# Patient Record
Sex: Female | Born: 1975 | Race: White | Hispanic: No | Smoking: Current every day smoker
Health system: Southern US, Community
[De-identification: ages and names within clinical notes are randomized; demographics above are authoritative.]

## PROBLEM LIST (undated history)

## (undated) DIAGNOSIS — F32A Depression, unspecified: Secondary | ICD-10-CM

## (undated) DIAGNOSIS — B019 Varicella without complication: Secondary | ICD-10-CM

## (undated) DIAGNOSIS — F329 Major depressive disorder, single episode, unspecified: Secondary | ICD-10-CM

## (undated) DIAGNOSIS — J439 Emphysema, unspecified: Secondary | ICD-10-CM

## (undated) DIAGNOSIS — R519 Headache, unspecified: Secondary | ICD-10-CM

## (undated) DIAGNOSIS — R51 Headache: Secondary | ICD-10-CM

## (undated) DIAGNOSIS — F419 Anxiety disorder, unspecified: Secondary | ICD-10-CM

## (undated) DIAGNOSIS — E039 Hypothyroidism, unspecified: Secondary | ICD-10-CM

## (undated) DIAGNOSIS — G43909 Migraine, unspecified, not intractable, without status migrainosus: Secondary | ICD-10-CM

## (undated) DIAGNOSIS — T7840XA Allergy, unspecified, initial encounter: Secondary | ICD-10-CM

## (undated) HISTORY — DX: Headache, unspecified: R51.9

## (undated) HISTORY — DX: Migraine, unspecified, not intractable, without status migrainosus: G43.909

## (undated) HISTORY — DX: Varicella without complication: B01.9

## (undated) HISTORY — DX: Headache: R51

## (undated) HISTORY — DX: Anxiety disorder, unspecified: F41.9

## (undated) HISTORY — DX: Depression, unspecified: F32.A

## (undated) HISTORY — DX: Allergy, unspecified, initial encounter: T78.40XA

## (undated) HISTORY — DX: Hypothyroidism, unspecified: E03.9

## (undated) HISTORY — DX: Major depressive disorder, single episode, unspecified: F32.9

## (undated) HISTORY — DX: Emphysema, unspecified: J43.9

## (undated) HISTORY — PX: NO PAST SURGERIES: SHX2092

---

## 2004-06-10 ENCOUNTER — Ambulatory Visit: Payer: Self-pay | Admitting: Family Medicine

## 2007-12-02 ENCOUNTER — Ambulatory Visit: Payer: Self-pay | Admitting: Unknown Physician Specialty

## 2008-08-09 ENCOUNTER — Ambulatory Visit: Payer: Self-pay | Admitting: Nurse Practitioner

## 2009-10-01 ENCOUNTER — Ambulatory Visit: Payer: Self-pay | Admitting: Obstetrics and Gynecology

## 2010-05-14 ENCOUNTER — Ambulatory Visit: Payer: Self-pay | Admitting: Internal Medicine

## 2011-01-17 ENCOUNTER — Other Ambulatory Visit (HOSPITAL_COMMUNITY): Payer: Self-pay | Admitting: Family Medicine

## 2011-01-17 DIAGNOSIS — R599 Enlarged lymph nodes, unspecified: Secondary | ICD-10-CM

## 2011-01-20 ENCOUNTER — Ambulatory Visit (HOSPITAL_COMMUNITY)
Admission: RE | Admit: 2011-01-20 | Discharge: 2011-01-20 | Disposition: A | Discharge status: Home / Self Care | Payer: 59 | Source: Ambulatory Visit | Attending: Family Medicine | Admitting: Family Medicine

## 2011-01-20 DIAGNOSIS — R599 Enlarged lymph nodes, unspecified: Secondary | ICD-10-CM

## 2011-05-05 ENCOUNTER — Ambulatory Visit: Payer: Self-pay

## 2011-12-29 ENCOUNTER — Other Ambulatory Visit (INDEPENDENT_AMBULATORY_CARE_PROVIDER_SITE_OTHER): Payer: 59

## 2011-12-29 ENCOUNTER — Encounter: Payer: Self-pay | Admitting: Physician Assistant

## 2011-12-29 ENCOUNTER — Ambulatory Visit: Payer: 59 | Admitting: Internal Medicine

## 2011-12-29 ENCOUNTER — Ambulatory Visit (INDEPENDENT_AMBULATORY_CARE_PROVIDER_SITE_OTHER): Payer: 59 | Admitting: Physician Assistant

## 2011-12-29 VITALS — BP 108/64 | HR 72 | Ht 67.0 in | Wt 168.0 lb

## 2011-12-29 DIAGNOSIS — F32A Depression, unspecified: Secondary | ICD-10-CM | POA: Insufficient documentation

## 2011-12-29 DIAGNOSIS — E039 Hypothyroidism, unspecified: Secondary | ICD-10-CM

## 2011-12-29 DIAGNOSIS — F329 Major depressive disorder, single episode, unspecified: Secondary | ICD-10-CM | POA: Insufficient documentation

## 2011-12-29 DIAGNOSIS — F341 Dysthymic disorder: Secondary | ICD-10-CM

## 2011-12-29 DIAGNOSIS — R110 Nausea: Secondary | ICD-10-CM

## 2011-12-29 DIAGNOSIS — R197 Diarrhea, unspecified: Secondary | ICD-10-CM

## 2011-12-29 LAB — BASIC METABOLIC PANEL
BUN: 8 mg/dL (ref 6–23)
Calcium: 8.8 mg/dL (ref 8.4–10.5)
GFR: 95.34 mL/min (ref >60.00)
Glucose, Bld: 77 mg/dL (ref 70–99)

## 2011-12-29 LAB — CBC WITH DIFFERENTIAL/PLATELET
Basophils Relative: 1 % (ref 0.0–3.0)
Eosinophils Relative: 5.4 % — ABNORMAL HIGH (ref 0.0–5.0)
HCT: 40 % (ref 36.0–46.0)
Lymphs Abs: 2.1 10*3/uL (ref 0.7–4.0)
MCV: 90 fl (ref 78.0–100.0)
Monocytes Absolute: 0.7 10*3/uL (ref 0.1–1.0)
RBC: 4.44 Mil/uL (ref 3.87–5.11)
WBC: 8.1 10*3/uL (ref 4.5–10.5)

## 2011-12-29 MED ORDER — ONDANSETRON 4 MG PO TBDP: ORAL_TABLET | ORAL | Status: DC

## 2011-12-29 MED ORDER — GLYCOPYRROLATE 2 MG PO TABS: 2.0000 mg | ORAL_TABLET | Freq: Two times a day (BID) | ORAL | Status: DC

## 2011-12-29 NOTE — Progress Notes (Addendum)
Subjective:    Patient ID: Audrey Ortega, female    DOB: 06/19/1976, 36 y.o.   MRN: [id number]  HPI Audrey Ortega is a very nice 36 year old white female, due to GI today. She is a primary care patient at the [facility], Dr. Georgina Quint. She has no prior history of GI issues. She comes in today for new onset of nausea over the past 2 weeks which has been associated with diarrhea. She says she's extremely nauseated in the mornings for several hours and then this gradually improves as the day goes on. She has not had any vomiting. Her appetite has been fair, she thinks her weight is down a few pounds. She's not had any documented fever or chills but has felt warm intermittently She denies any heartburn indigestion or dysphagia but says she has had a lot of belching and burping. She is not having any real abdominal pain, she has had some intermittent mild cramping before bowel movements and has had significant urgency. Most days she's having 4-5 bowel movements per day she thinks on her worse today she probably went 9 or 10 times. The stool has been watery to loose and nonbloody. She says typically she is constipated says is quite unusual for her. She has not been on any new medications supplements etc. She did take a Z-Pak a couple of months ago for sinusitis.    Review of Systems  Constitutional: Positive for fatigue.  HENT: Negative.   Eyes: Negative.   Respiratory: Negative.   Cardiovascular: Negative.   Gastrointestinal: Positive for nausea and diarrhea.  Genitourinary: Negative.   Musculoskeletal: Negative.   Neurological: Negative.   Hematological: Negative.   Psychiatric/Behavioral: Negative.    Outpatient Encounter Prescriptions as of 12/29/2011  Medication Sig Dispense Refill  . ALPRAZolam (XANAX) 0.5 MG tablet Take 0.5 mg by mouth as needed.      Marland Kitchen FLUoxetine (PROZAC) 20 MG capsule Take 20 mg by mouth daily.      Marland Kitchen levothyroxine (SYNTHROID, LEVOTHROID) 88 MCG tablet Take 88 mcg by mouth daily.       Marland Kitchen glycopyrrolate (ROBINUL-FORTE) 2 MG tablet Take 1 tablet (2 mg total) by mouth 2 (two) times daily.  60 tablet  0  . ondansetron (ZOFRAN ODT) 4 MG disintegrating tablet Take 1 tablet by mouth on the tongue every 6 hours as needed for nausea  40 tablet  0   Allergies  Allergen Reactions  . Augmentin (Amoxicillin-Pot Clavulanate) Hives   Patient Active Problem List  Diagnoses  . Hypothyroid  . Anxiety and depression   History   Social History  . Marital Status: Married    Spouse Name: N/A    Number of Children: 1  . Years of Education: N/A   Occupational History  . Physician Network Coordinator [employer]   Social History Main Topics  . Smoking status: Current Everyday Smoker -- 0.5 packs/day for 20 years    Types: Cigarettes  . Smokeless tobacco: Never Used  . Alcohol Use: No  . Drug Use: No  . Sexually Active: Not on file   Other Topics Concern  . Not on file   Social History Narrative  . No narrative on file   Family History  Problem Relation Age of Onset  . Diabetes Paternal Grandmother   . Diabetes Paternal Grandfather        Objective:   Physical Exam well-developed white female in no acute distress, pleasant blood pressure 108/64 pulse 72 height 5 foot 7 weight 168. HEENT; nontraumatic  normocephalic EOMI PERRLA sclera anicteric Neck;, Supple no JVD, Cardiovascular; regular rate and rhythm with S1-S2 no murmur or gallop, Pulmonary; clear bilaterally, Abdomen ;soft nondistended bowel sounds somewhat hyperactive no palpable mass or hepatosplenomegaly and no focal tenderness in Rectal not done, Extremities; no clubbing cyanosis or edema skin warm and dry, Psych; mood and affect normal and appropriate        Assessment & Plan:  #37 36 year old female, generally healthy with 2 week history of nausea and diarrhea. Suspect this is an infectious gastroenteritis, possibly viral.  Plan; CBC with differential, BMET, and serum pregnancy test today  Stool for  lactoferrin, culture, O&P culture and C. difficile PCR. Soft bland diet Start Zofran ODT 4 mg every 6 hours as needed for nausea Start Robinul Forte 2 mg by mouth twice a day when necessary diarrhea and cramping. Patient was advised she could start Imodium as needed once we have ruled out C.diff  Addendum: Reviewed and agree with initial management. Beverley Fiedler, MD

## 2011-12-29 NOTE — Patient Instructions (Signed)
Please go to the basement level to have your labs drawn.   We have sent prescriptions to [facility] for: 1. Robinul forte for cramping and spasms, pain. 2. Zofran for nausea

## 2012-08-20 ENCOUNTER — Ambulatory Visit: Payer: Self-pay | Admitting: Emergency Medicine

## 2012-08-20 LAB — URINALYSIS, COMPLETE
Glucose,UR: NEGATIVE mg/dL (ref 0–75)
Ketone: NEGATIVE
Leukocyte Esterase: NEGATIVE
Nitrite: NEGATIVE
Ph: 6.5 (ref 4.5–8.0)
RBC,UR: NONE SEEN /HPF (ref 0–5)
Specific Gravity: 1.01 (ref 1.003–1.030)

## 2012-11-10 ENCOUNTER — Ambulatory Visit: Payer: Self-pay | Admitting: Family Medicine

## 2012-12-24 ENCOUNTER — Ambulatory Visit (INDEPENDENT_AMBULATORY_CARE_PROVIDER_SITE_OTHER): Payer: 59 | Admitting: Family Medicine

## 2012-12-24 ENCOUNTER — Encounter: Payer: Self-pay | Admitting: Family Medicine

## 2012-12-24 VITALS — BP 108/70 | HR 85 | Temp 98.5°F | Ht 65.5 in | Wt 165.0 lb

## 2012-12-24 DIAGNOSIS — F17200 Nicotine dependence, unspecified, uncomplicated: Secondary | ICD-10-CM

## 2012-12-24 DIAGNOSIS — Z0000 Encounter for general adult medical examination without abnormal findings: Secondary | ICD-10-CM

## 2012-12-24 DIAGNOSIS — F341 Dysthymic disorder: Secondary | ICD-10-CM

## 2012-12-24 DIAGNOSIS — F419 Anxiety disorder, unspecified: Secondary | ICD-10-CM

## 2012-12-24 DIAGNOSIS — E039 Hypothyroidism, unspecified: Secondary | ICD-10-CM

## 2012-12-24 DIAGNOSIS — F329 Major depressive disorder, single episode, unspecified: Secondary | ICD-10-CM

## 2012-12-24 LAB — LIPID PANEL
Cholesterol: 149 mg/dL (ref 0–200)
HDL: 34 mg/dL — ABNORMAL LOW (ref >39)
Total CHOL/HDL Ratio: 4.4 Ratio
VLDL: 17 mg/dL (ref 0–40)

## 2012-12-24 LAB — BASIC METABOLIC PANEL
Calcium: 9 mg/dL (ref 8.4–10.5)
Creat: 0.75 mg/dL (ref 0.50–1.10)
Sodium: 135 mEq/L (ref 135–145)

## 2012-12-24 LAB — CBC WITH DIFFERENTIAL/PLATELET
Basophils Relative: 1 % (ref 0–1)
Hemoglobin: 13.3 g/dL (ref 12.0–15.0)
Lymphocytes Relative: 24 % (ref 12–46)
Lymphs Abs: 2.4 10*3/uL (ref 0.7–4.0)
MCHC: 33.8 g/dL (ref 30.0–36.0)
Monocytes Relative: 8 % (ref 3–12)
Neutro Abs: 6.3 10*3/uL (ref 1.7–7.7)
Neutrophils Relative %: 64 % (ref 43–77)
RBC: 4.57 MIL/uL (ref 3.87–5.11)
WBC: 9.8 10*3/uL (ref 4.0–10.5)

## 2012-12-24 LAB — HEPATIC FUNCTION PANEL
AST: 15 U/L (ref 0–37)
Albumin: 4.3 g/dL (ref 3.5–5.2)
Alkaline Phosphatase: 53 U/L (ref 39–117)
Total Protein: 6.7 g/dL (ref 6.0–8.3)

## 2012-12-24 MED ORDER — FLUOXETINE HCL 20 MG PO CAPS: 20.0000 mg | ORAL_CAPSULE | Freq: Every day | ORAL | Status: DC

## 2012-12-24 MED ORDER — ALPRAZOLAM 0.5 MG PO TABS: 0.5000 mg | ORAL_TABLET | ORAL | Status: DC | PRN

## 2012-12-24 NOTE — Progress Notes (Signed)
  Subjective:    Patient ID: Audrey Ortega, female    DOB: Mar 17, 1976, 37 y.o.   MRN: [id number]  HPI New to establish.  Previous MD- Alice Reichert.    Hypothyroid- chronic problem, dx'd 2 yrs ago.  Has been off meds for 'a couple months'.  Denies fatigue.  No constipation.  + dry skin and hair.  + family hx of thyroid issues.  Anxiety/depression- chronic problem, previously on Prozac.  Weaned off meds.  Pt feels she may need to restart.  Going through separation and divorce.  Will use 1/2 xanax prn.  Develops stress hives.  Did not do well on Lexapro.  Tobacco use- started smoking 20 yrs ago.  Smoking 1/2 ppd.  Pt reports not ready to quit.  Has attempted to quit many times.  Has tried Chantix before and this caused 'crazy dreams'.   Review of Systems For ROS see HPI     Objective:   Physical Exam  Vitals reviewed. Constitutional: She is oriented to person, place, and time. She appears well-developed and well-nourished. No distress.  HENT:  Head: Normocephalic and atraumatic.  Eyes: Conjunctivae and EOM are normal. Pupils are equal, round, and reactive to light.  Neck: Normal range of motion. Neck supple. No thyromegaly present.  Cardiovascular: Normal rate, regular rhythm, normal heart sounds and intact distal pulses.   No murmur heard. Pulmonary/Chest: Effort normal and breath sounds normal. No respiratory distress.  Abdominal: Soft. She exhibits no distension. There is no tenderness.  Musculoskeletal: She exhibits no edema.  Lymphadenopathy:    She has no cervical adenopathy.  Neurological: She is alert and oriented to person, place, and time.  Skin: Skin is warm and dry.  Psychiatric: She has a normal mood and affect. Her behavior is normal.          Assessment & Plan:

## 2012-12-24 NOTE — Patient Instructions (Addendum)
Schedule your complete physical w/ pap at your convenience We'll notify you of your lab results and make any changes if needed Restart the Prozac Use the Alprazolam as needed for rescue Call with any questions or concerns Welcome!  We're glad to have you!!!

## 2012-12-27 ENCOUNTER — Telehealth: Payer: Self-pay | Admitting: *Deleted

## 2012-12-27 DIAGNOSIS — E039 Hypothyroidism, unspecified: Secondary | ICD-10-CM

## 2012-12-27 MED ORDER — LEVOTHYROXINE SODIUM 100 MCG PO TABS: 100.0000 ug | ORAL_TABLET | Freq: Every day | ORAL | Status: DC

## 2012-12-27 NOTE — Telephone Encounter (Signed)
Message copied by Verdie Shire on Mon Dec 27, 2012  4:28 PM ------      Message from: Sheliah Hatch      Created: Sun Dec 26, 2012  9:22 AM       Labs look good w/ exception of abnormal TSH- needs to start Synthroid daily ------

## 2012-12-27 NOTE — Telephone Encounter (Signed)
Spoke with the pt and informed her of recent lab results and note.  Pt understood and agreed.   New rx sent to the pharmacy by e-script, and future order for TSH ordered and sent.  Pt will recheck TSH in 2 mos per Dr. Deboraha Sprang

## 2012-12-28 ENCOUNTER — Other Ambulatory Visit: Payer: Self-pay | Admitting: General Practice

## 2012-12-28 DIAGNOSIS — F419 Anxiety disorder, unspecified: Secondary | ICD-10-CM

## 2012-12-28 MED ORDER — ALPRAZOLAM 0.5 MG PO TABS: 0.5000 mg | ORAL_TABLET | Freq: Three times a day (TID) | ORAL | Status: DC | PRN

## 2012-12-30 LAB — VITAMIN D 1,25 DIHYDROXY: Vitamin D2 1, 25 (OH)2: <8 pg/mL

## 2013-01-16 NOTE — Assessment & Plan Note (Signed)
New to provider, chronic for pt.  Check labs.  Restart meds prn.  Will follow.

## 2013-01-16 NOTE — Assessment & Plan Note (Signed)
New to provider, chronic for pt.  Restart meds b/c sxs are not well controlled.  Will follow.

## 2013-01-16 NOTE — Assessment & Plan Note (Signed)
New.  Pt reports she's not ready to quit.  Will follow and revisit at future appts.

## 2013-03-11 ENCOUNTER — Ambulatory Visit: Payer: Self-pay | Admitting: Emergency Medicine

## 2013-03-11 ENCOUNTER — Emergency Department: Payer: Self-pay | Admitting: Emergency Medicine

## 2013-03-11 LAB — BASIC METABOLIC PANEL
Anion Gap: 8 (ref 7–16)
BUN: 10 mg/dL (ref 7–18)
Calcium, Total: 9.3 mg/dL (ref 8.5–10.1)
Chloride: 100 mmol/L (ref 98–107)
Co2: 28 mmol/L (ref 21–32)
Creatinine: 0.9 mg/dL (ref 0.60–1.30)
EGFR (African American): >60
EGFR (Non-African Amer.): >60
Glucose: 95 mg/dL (ref 65–99)
Osmolality: 271 (ref 275–301)
Potassium: 3.8 mmol/L (ref 3.5–5.1)
Sodium: 136 mmol/L (ref 136–145)

## 2013-03-11 LAB — CBC WITH DIFFERENTIAL/PLATELET
Basophil #: 0.2 10*3/uL — ABNORMAL HIGH (ref 0.0–0.1)
Basophil %: 0.9 %
Eosinophil #: 0.3 10*3/uL (ref 0.0–0.7)
Eosinophil %: 1.8 %
HCT: 42.3 % (ref 35.0–47.0)
HGB: 13.8 g/dL (ref 12.0–16.0)
Lymphocyte #: 1.7 10*3/uL (ref 1.0–3.6)
Lymphocyte %: 9.4 %
MCH: 28.8 pg (ref 26.0–34.0)
MCHC: 32.7 g/dL (ref 32.0–36.0)
MCV: 88 fL (ref 80–100)
Monocyte #: 0.9 x10 3/mm (ref 0.2–0.9)
Monocyte %: 5.3 %
Neutrophil #: 14.5 10*3/uL — ABNORMAL HIGH (ref 1.4–6.5)
Neutrophil %: 82.6 %
Platelet: 269 10*3/uL (ref 150–440)
RBC: 4.8 10*6/uL (ref 3.80–5.20)
RDW: 13.1 % (ref 11.5–14.5)
WBC: 17.5 10*3/uL — ABNORMAL HIGH (ref 3.6–11.0)

## 2013-03-11 LAB — URIC ACID: Uric Acid: 4.3 mg/dL (ref 2.6–6.0)

## 2013-03-11 LAB — SEDIMENTATION RATE: Erythrocyte Sed Rate: 3 mm/hr (ref 0–20)

## 2013-04-14 ENCOUNTER — Other Ambulatory Visit: Payer: Self-pay | Admitting: Family Medicine

## 2013-04-14 NOTE — Telephone Encounter (Signed)
Rx filled. Pt made aware to schedule OV.

## 2013-05-31 ENCOUNTER — Encounter: Payer: Self-pay | Admitting: Lab

## 2013-06-01 ENCOUNTER — Ambulatory Visit: Payer: 59 | Admitting: Family Medicine

## 2013-06-08 ENCOUNTER — Encounter: Payer: Self-pay | Admitting: Lab

## 2013-06-09 ENCOUNTER — Encounter: Payer: Self-pay | Admitting: Lab

## 2013-06-09 ENCOUNTER — Ambulatory Visit (INDEPENDENT_AMBULATORY_CARE_PROVIDER_SITE_OTHER): Payer: 59 | Admitting: Family Medicine

## 2013-06-09 ENCOUNTER — Encounter: Payer: Self-pay | Admitting: Family Medicine

## 2013-06-09 VITALS — BP 120/80 | HR 72 | Temp 98.1°F | Resp 16 | Wt 163.4 lb

## 2013-06-09 DIAGNOSIS — L723 Sebaceous cyst: Secondary | ICD-10-CM

## 2013-06-09 DIAGNOSIS — E039 Hypothyroidism, unspecified: Secondary | ICD-10-CM

## 2013-06-09 DIAGNOSIS — L089 Local infection of the skin and subcutaneous tissue, unspecified: Secondary | ICD-10-CM | POA: Insufficient documentation

## 2013-06-09 MED ORDER — SULFAMETHOXAZOLE-TMP DS 800-160 MG PO TABS: 1.0000 | ORAL_TABLET | Freq: Two times a day (BID) | ORAL | Status: DC

## 2013-06-09 NOTE — Progress Notes (Signed)
  Subjective:    Patient ID: Audrey Ortega, female    DOB: 17-Jul-1976, 37 y.o.   MRN: [id number]  HPI Pre visit review using our clinic review tool, if applicable. No additional management support is needed unless otherwise documented below in the visit note.   Hypothyroid- pt was started on of Synthroid at last visit.  Lost med for a few weeks but has been taking x5 days.  Energy improved, less fatigue since starting meds.  Skin bumps- behind R ear, 'very painful'.  Present x1 yr.  Has 2 similar bumps behind L ear.  Painful to sleep or talk on phone.  No drainage from bumps.   Review of Systems For ROS see HPI     Objective:   Physical Exam  Vitals reviewed. Constitutional: She is oriented to person, place, and time. She appears well-developed and well-nourished. No distress.  HENT:  Head: Normocephalic and atraumatic.  Eyes: Conjunctivae and EOM are normal. Pupils are equal, round, and reactive to light.  Neck: Normal range of motion. Neck supple. No thyromegaly present.  Cardiovascular: Normal rate, regular rhythm, normal heart sounds and intact distal pulses.   No murmur heard. Pulmonary/Chest: Effort normal and breath sounds normal. No respiratory distress.  Abdominal: Soft. She exhibits no distension. There is no tenderness.  Musculoskeletal: She exhibits no edema.  Lymphadenopathy:    She has no cervical adenopathy.  Neurological: She is alert and oriented to person, place, and time.  Skin: Skin is warm and dry. There is erythema (overlying sebaceous cyst behind R ear, very TTP.  area prepped w/ ETOH, I&D'd w/ needle aspiration, copious foul smelling debris expressed).  Psychiatric: She has a normal mood and affect. Her behavior is normal.          Assessment & Plan:

## 2013-06-09 NOTE — Patient Instructions (Signed)
Follow up as needed Hot compresses to the areas to bring them to a head an facilitate drainage Start the Bactrim twice daily- take w/ food We'll notify you of your lab results and make any changes if needed Call with any questions or concerns Happy Holidays!

## 2013-06-10 ENCOUNTER — Other Ambulatory Visit: Payer: Self-pay | Admitting: General Practice

## 2013-06-10 ENCOUNTER — Telehealth: Payer: Self-pay | Admitting: *Deleted

## 2013-06-10 DIAGNOSIS — F329 Major depressive disorder, single episode, unspecified: Secondary | ICD-10-CM

## 2013-06-10 LAB — T3, FREE: T3, Free: 2.9 pg/mL (ref 2.3–4.2)

## 2013-06-10 LAB — T4, FREE: Free T4: 0.83 ng/dL (ref 0.60–1.60)

## 2013-06-10 LAB — TSH: TSH: 5.88 u[IU]/mL — ABNORMAL HIGH (ref 0.35–5.50)

## 2013-06-10 MED ORDER — LEVOTHYROXINE SODIUM 112 MCG PO TABS: 112.0000 ug | ORAL_TABLET | Freq: Every day | ORAL | Status: DC

## 2013-06-10 NOTE — Telephone Encounter (Signed)
Pt.notified

## 2013-06-10 NOTE — Telephone Encounter (Signed)
The bloodshot eye is likely due to the pressure from yesterday's drainage of the infected cyst.  HA is due to the infection and the pressure on the nerve.  Alternate Tylenol and ibuprofen for HA.  The eye itself should not be painful and the redness will resolve w/ time.  If any visual changes or pain, needs to go to ER

## 2013-06-10 NOTE — Telephone Encounter (Signed)
Patient called and state that her eye is blood shot red, painful, and a headache.Patient states that this the same side that her ear was drained on. Patient was concerned. Please advised. SW

## 2013-06-12 NOTE — Assessment & Plan Note (Signed)
New.  Area behind R ear was I&D'd successfully w/ expression of copious infected sebaceous debris.  Start abx.  Reviewed supportive care and red flags that should prompt return.  Pt expressed understanding and is in agreement w/ plan.

## 2013-06-12 NOTE — Assessment & Plan Note (Signed)
Chronic problem.  Check labs.  Adjust meds prn  

## 2013-09-09 ENCOUNTER — Ambulatory Visit: Payer: Self-pay | Admitting: Physician Assistant

## 2014-01-28 IMAGING — CR DG ANKLE COMPLETE 3+V*L*
1 series · 5 of 5 positions shown · non-contrast
Comparison: none

REASON FOR EXAM: twisted
COMMENTS:

PROCEDURE:     MDR - MDR ANKLE LEFT COMPLETE  - March 11, 2013  [DATE]
RESULT:     No acute bony or joint abnormality. No fracture or dislocation.

[Series 1: ap · 0.17mm/px · 5 of 5 slices shown]
[im 1/5]
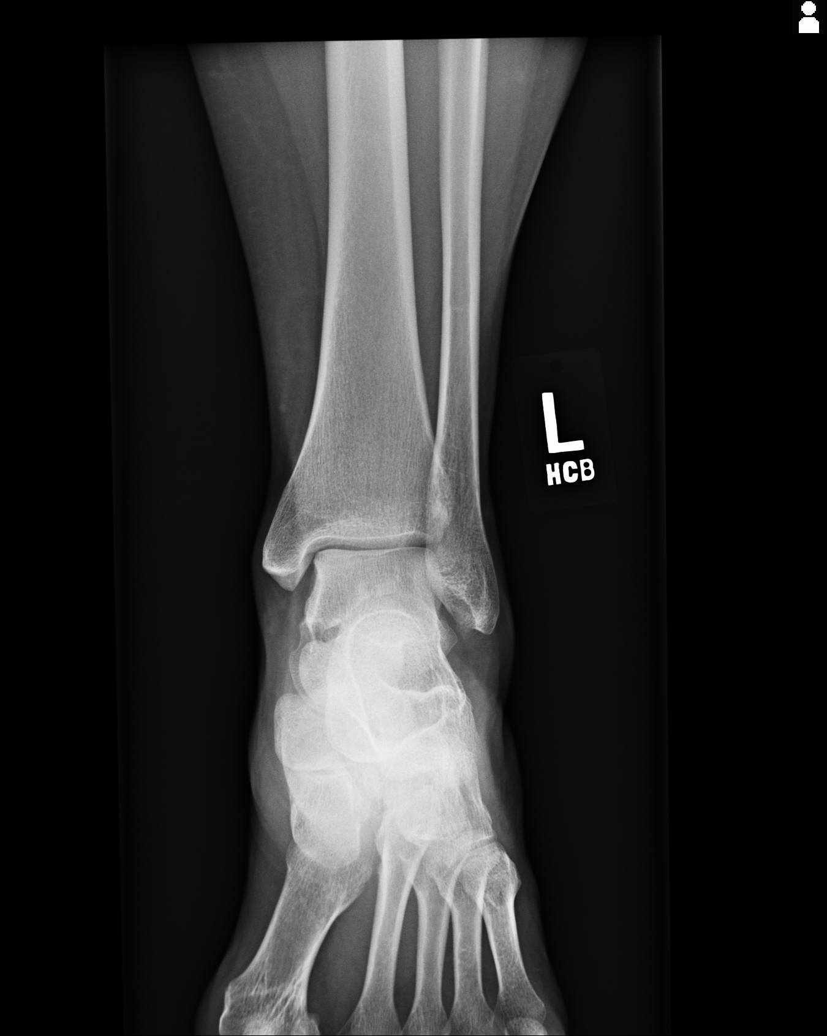
[im 2/5]
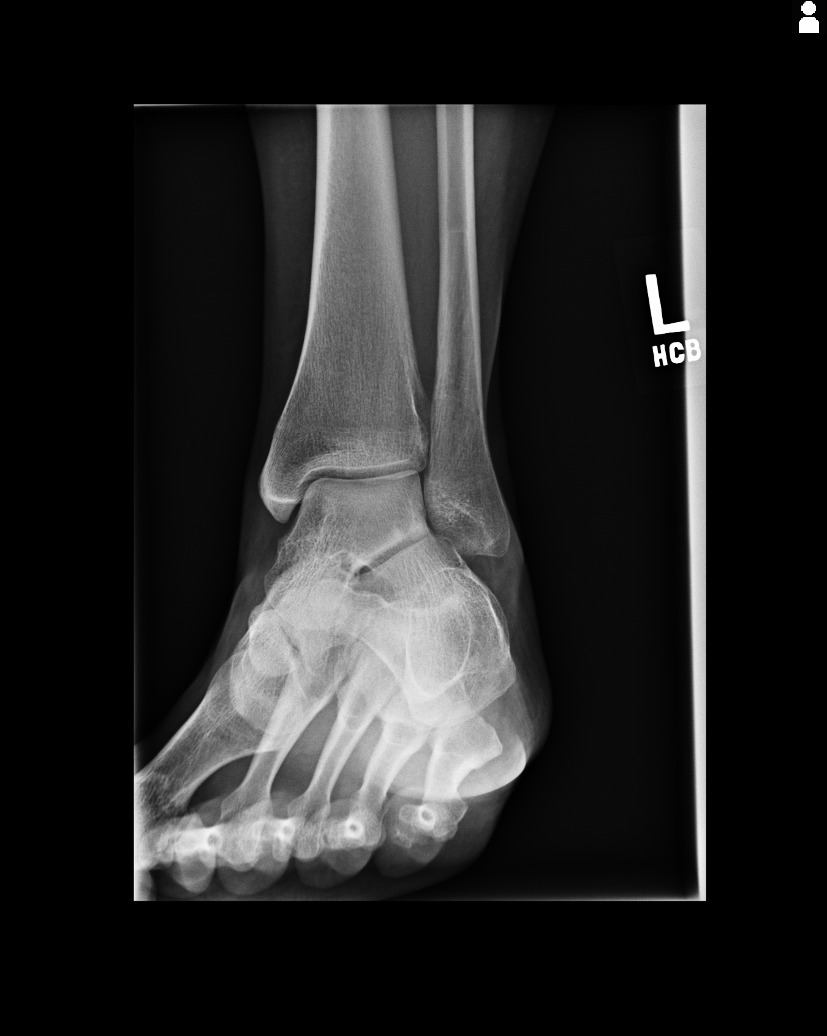
[im 3/5]
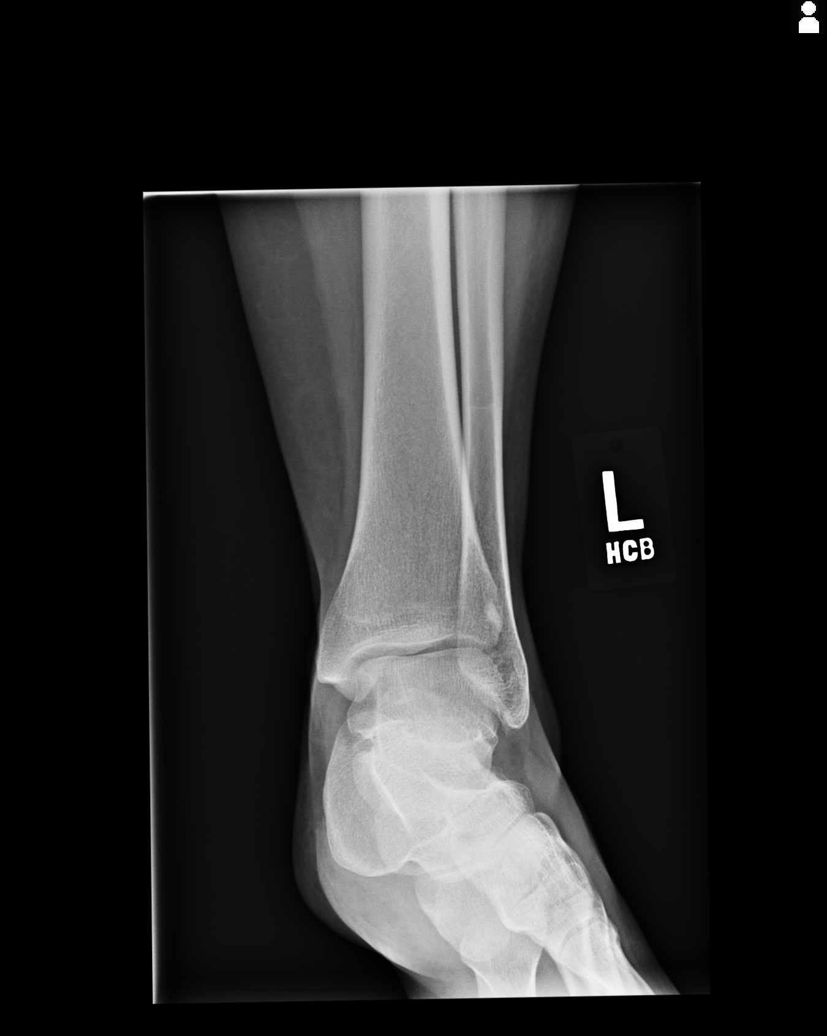
[im 4/5]
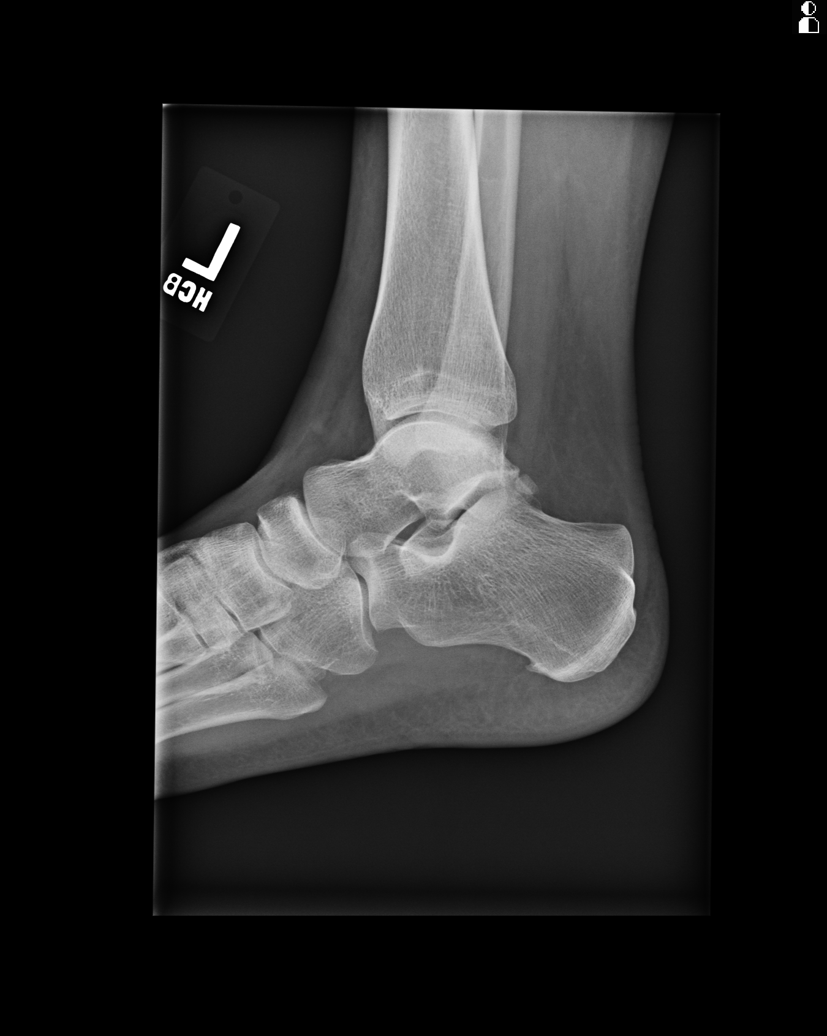
[im 5/5]
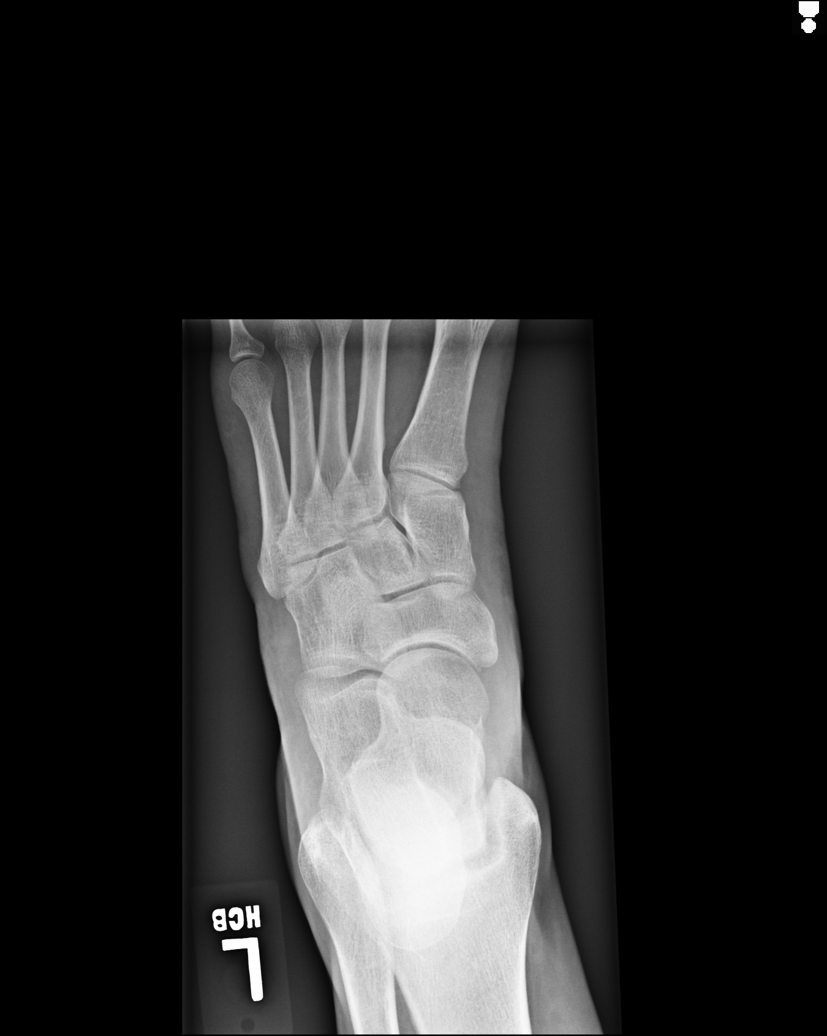

[5 of 5 positions shown; findings below may reference images not displayed]

IMPRESSION: No acute abnormality.

## 2014-01-28 IMAGING — CR DG FOOT COMPLETE 3+V*L*
1 series · 3 of 3 positions shown · non-contrast
Comparison: none

REASON FOR EXAM: trauma
COMMENTS:

PROCEDURE:     MDR - MDR FOOT LT COMP W/OBLQUES  - March 11, 2013  [DATE]
RESULT:     No acute bony or joint abnormality. No fracture.

[Series 1: ap · 0.17mm/px · 3 of 3 slices shown]
[im 1/3]
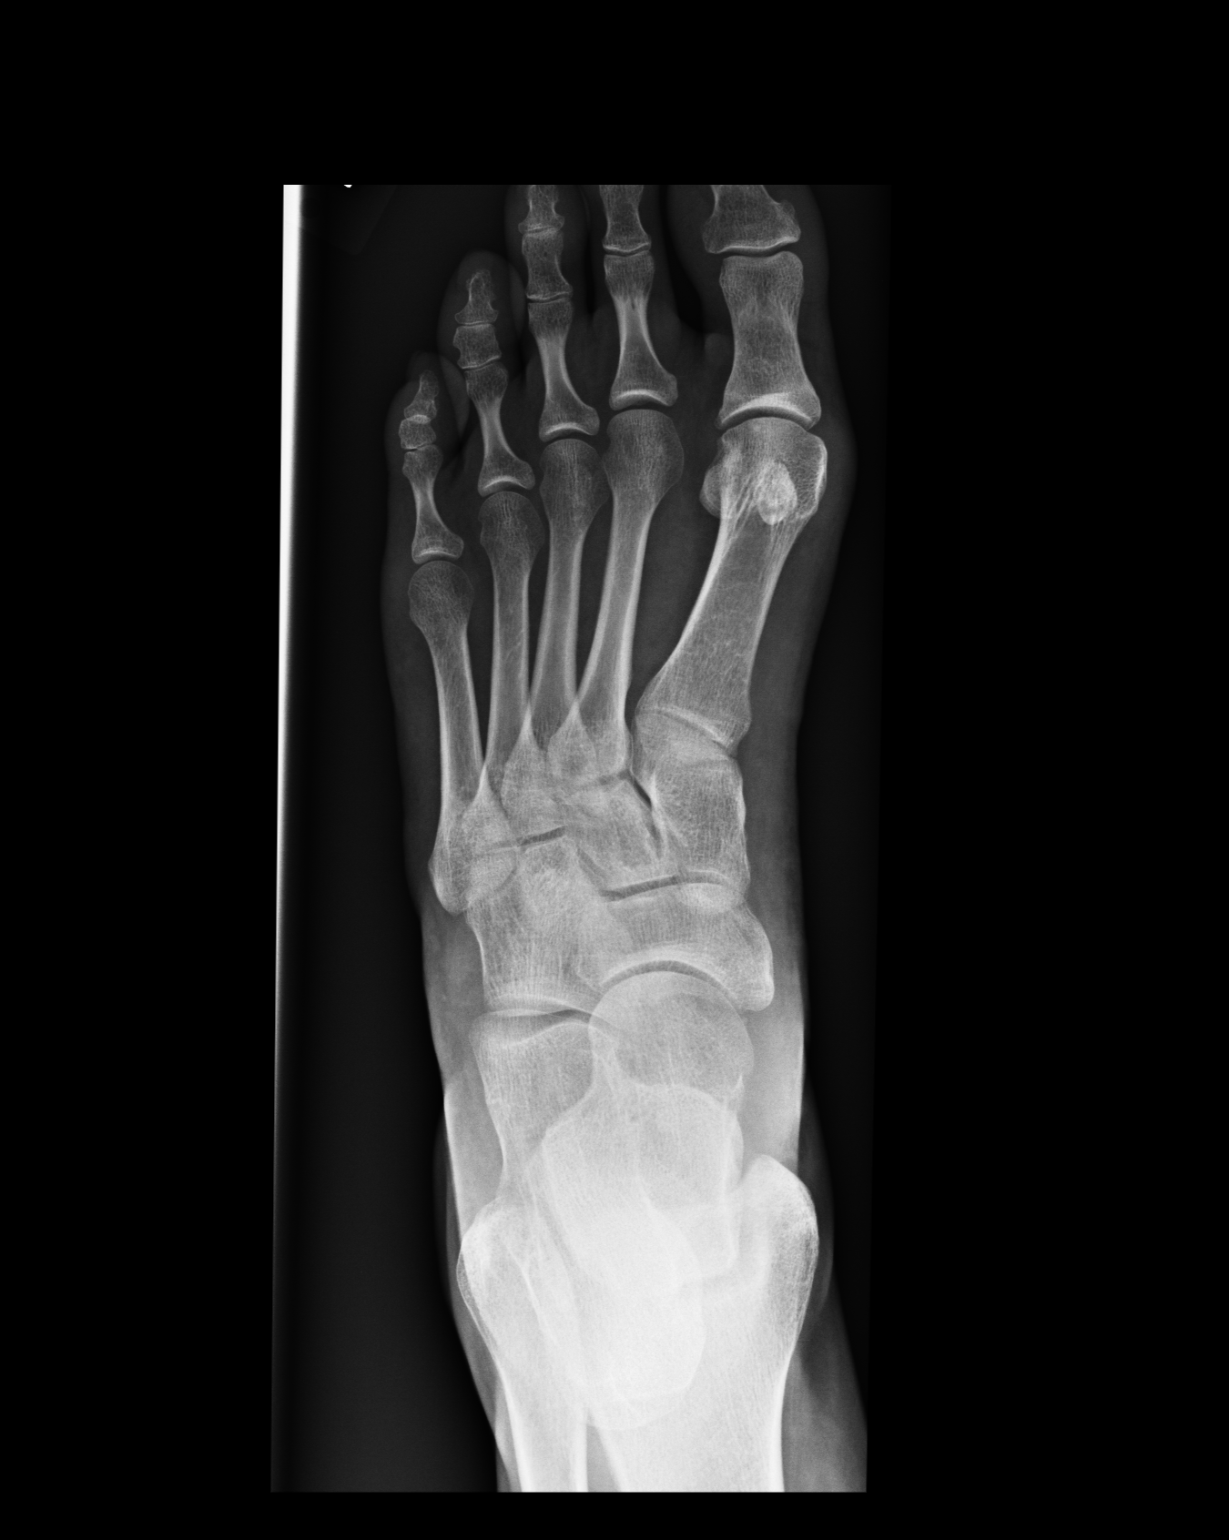
[im 2/3]
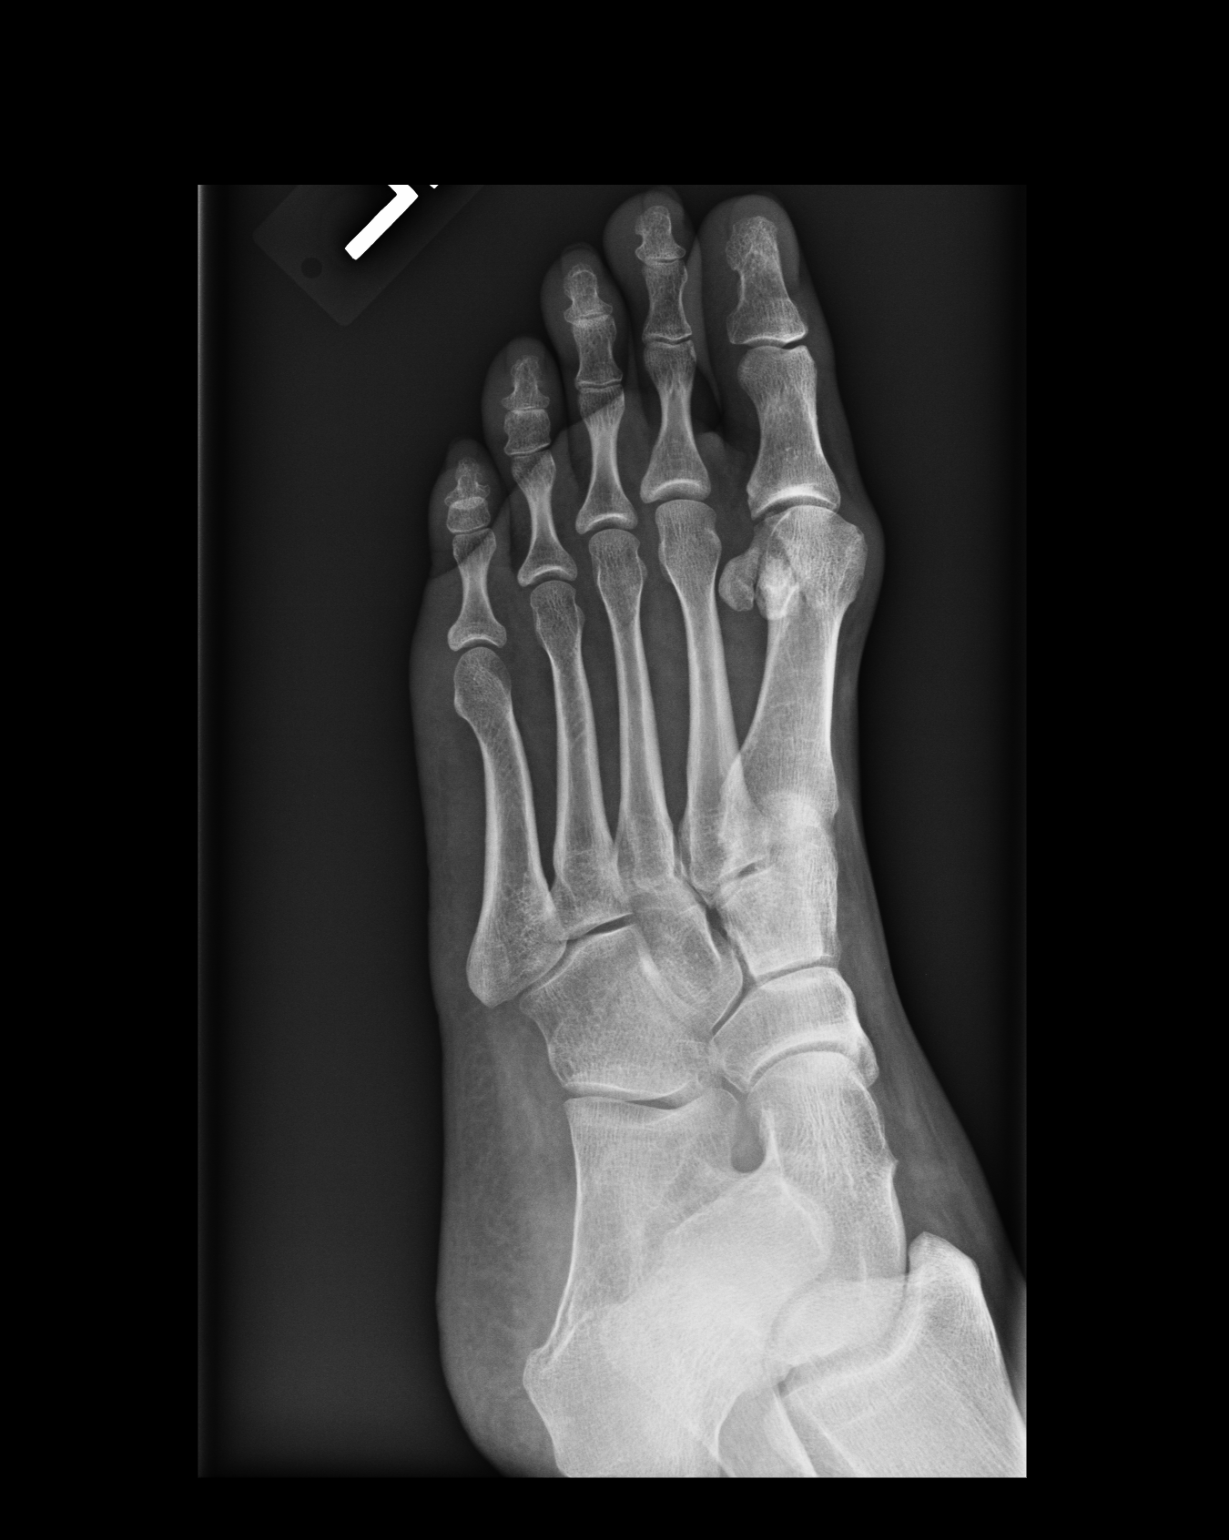
[im 3/3]
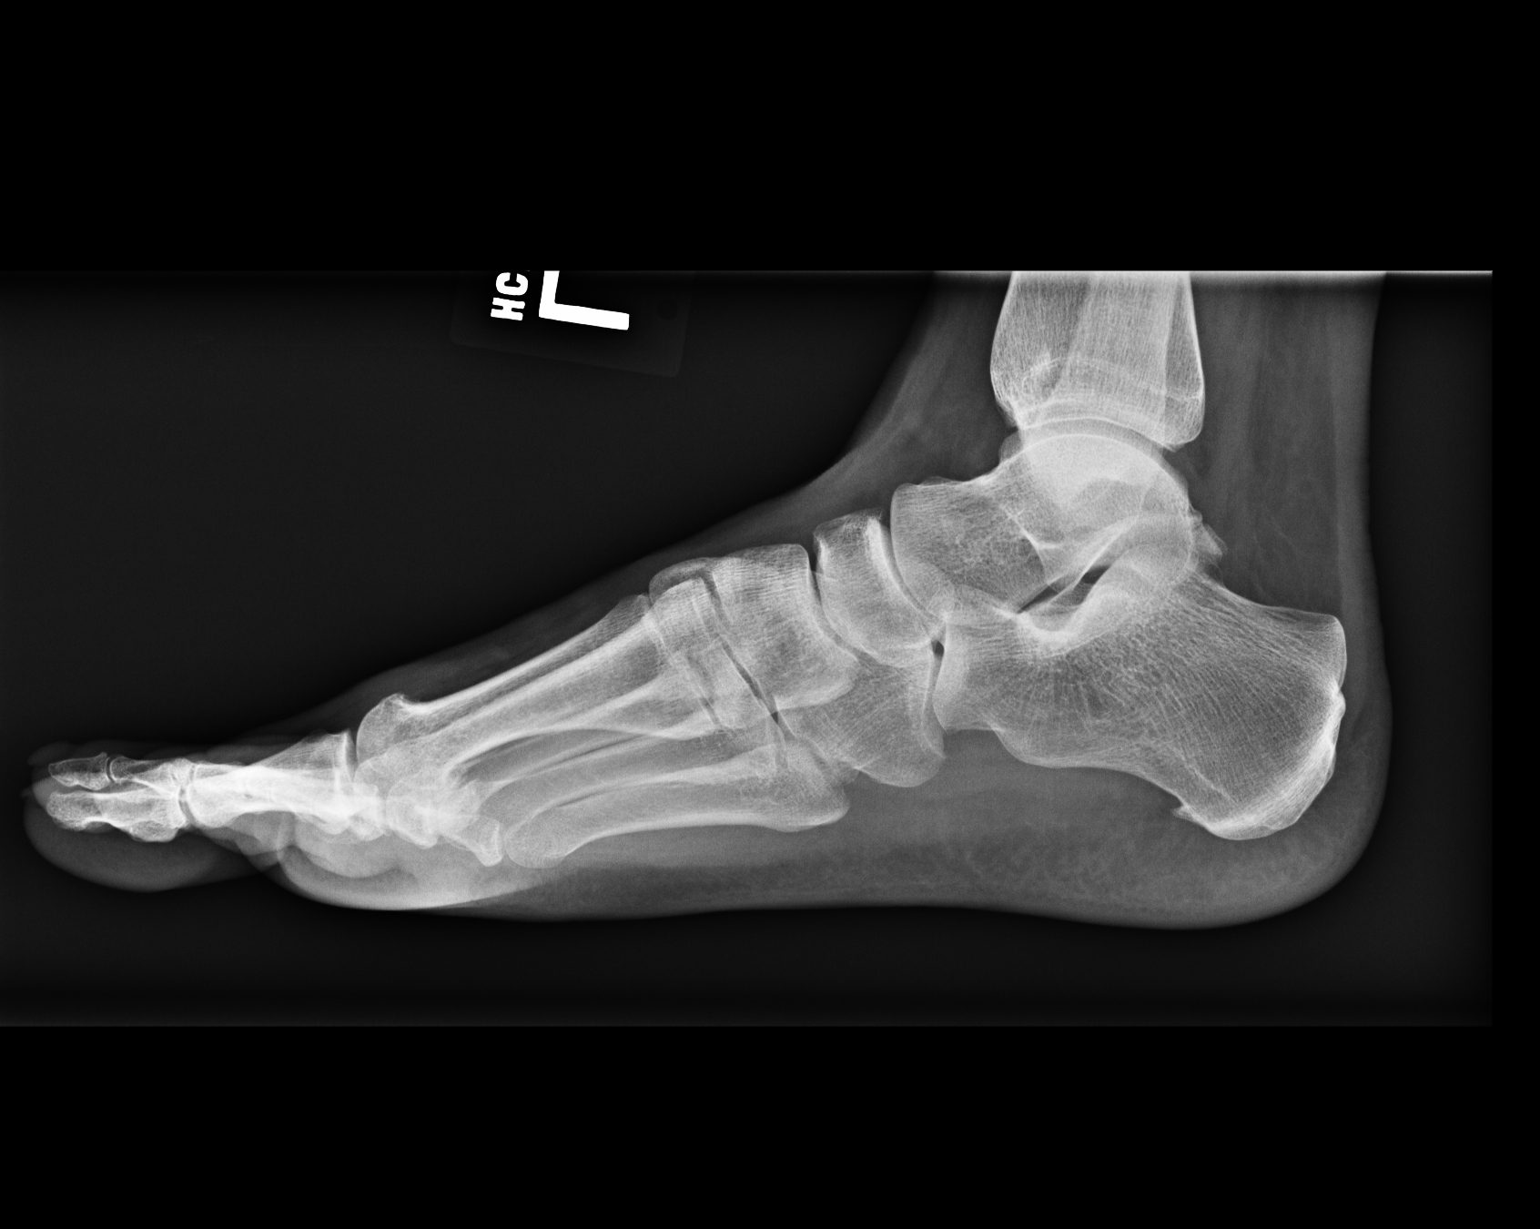

[3 of 3 positions shown; findings below may reference images not displayed]

IMPRESSION: No acute abnormality.

## 2014-08-26 ENCOUNTER — Ambulatory Visit: Payer: Self-pay | Admitting: Registered Nurse

## 2015-04-16 ENCOUNTER — Ambulatory Visit
Admission: EM | Admit: 2015-04-16 | Discharge: 2015-04-16 | Disposition: A | Discharge status: Home / Self Care | Payer: 59 | Attending: Emergency Medicine | Admitting: Emergency Medicine

## 2015-04-16 ENCOUNTER — Encounter: Payer: Self-pay | Admitting: Emergency Medicine

## 2015-04-16 DIAGNOSIS — Z711 Person with feared health complaint in whom no diagnosis is made: Secondary | ICD-10-CM | POA: Diagnosis not present

## 2015-04-16 NOTE — ED Provider Notes (Signed)
CSN: [id number]     Arrival date & time 04/16/15  1203 History   First MD Initiated Contact with Patient 04/16/15 1428     Chief Complaint  Patient presents with  . Foreign Body in Vagina   (Consider location/radiation/quality/duration/timing/severity/associated sxs/prior Treatment) HPI  39 yo F concerned about possibility of retained tampon. Cycled over the weekend., 04/12/2015 Can't remember removing it. Concerned. No pain ,no fever, no discharge, no intercourse in interim. Under stress - coping fragile.  Past Medical History  Diagnosis Date  . Anxiety   . Hypothyroidism   . Chicken pox   . Depression   . Emphysema of lung   . Frequent headaches   . Migraine    History reviewed. No pertinent past surgical history. Family History  Problem Relation Age of Onset  . Diabetes Paternal Grandmother   . Hypertension Paternal Grandmother   . Diabetes Paternal Grandfather   . Alcohol abuse Paternal Grandfather   . Hypertension Paternal Grandfather   . Arthritis Mother   . Hypertension Mother   . Arthritis Father   . Hypertension Father   . Cancer Maternal Aunt     breast  . Alcohol abuse Maternal Grandmother    Social History  Substance Use Topics  . Smoking status: Current Every Day Smoker -- 0.50 packs/day for 20 years    Types: Cigarettes  . Smokeless tobacco: Never Used  . Alcohol Use: No   OB History    No data available     Review of Systems.10 Systems reviewed and are negative for acute change except as noted in the HPI.  Allergies  Augmentin  Home Medications   Prior to Admission medications   Medication Sig Start Date End Date Taking? Authorizing Provider  ALPRAZolam Prudy Feeler) 0.5 MG tablet Take 1 tablet (0.5 mg total) by mouth 3 (three) times daily as needed. 12/28/12   Sheliah Hatch, MD  FLUoxetine (PROZAC) 20 MG capsule Take 1 capsule (20 mg total) by mouth daily. 12/24/12   Sheliah Hatch, MD  levothyroxine (SYNTHROID, LEVOTHROID) 112 MCG tablet  Take 1 tablet (112 mcg total) by mouth daily. 06/10/13   Sheliah Hatch, MD  sulfamethoxazole-trimethoprim (BACTRIM DS) 800-160 MG per tablet Take 1 tablet by mouth 2 (two) times daily. 06/09/13   Sheliah Hatch, MD   Meds Ordered and Administered this Visit  Medications - No data to display  BP 116/78 mmHg  Pulse 61  Temp(Src) 97 F (36.1 C) (Oral)  Resp 16  Ht  (1.702 m)  Wt 150 lb (68.04 kg)  BMI 23.49 kg/m2  SpO2 100%  LMP 04/12/2015 (Exact Date) No data found.   Physical Exam Constitutional: Alert and oriented, well appearing, VS are noted,  General : no acute distress; HEENT:  Head:normocephalic, atraumatic,                Eyes: conjugate gaze,negative conjunctiva     Ears:norml hearing     Nose:normal,     Mouth/throat :Mucous membranes moist, Neck :  supple  Lymph: without enlargement Lung:   effort and breath sounds normal , no distress Heart:   normal rate,regular rhythm,  Back:    No CVAT,  Abd :    soft, nontender,  Pelvi- EG BUS WNL, marital introitus, mucosa healthy , parous os , clear discharge, no tampon or other foreign body present vault Bimanual midplane , Uterus NSS , adnexa negative, RV deferred. MSK:   nontender, normal ROM all extremities;normal flexion; ambulatory, on  and off table without assistance Neuro:  normal gait, normal speech and language Skin:  Warm,dry,intact Psych: mood and affect WNL ED Course  Procedures (including critical care time)  Labs Review Labs Reviewed - No data to display  Imaging Review No results found.    MDM   1. Physically well but worried    Appropriate vaginal vault exploration technique reviewed/discussed/reassured. Situational stress discussed. Exercise encouraged-defer intervention at this time- has outside contact Questions fielded, expectations and recommendations reviewed.  Patient expresses understanding. Will return to [facility] with questions, concern or exacerbation.     Rae Halsted,  PA-C 04/16/15 1502

## 2015-04-16 NOTE — ED Notes (Signed)
Patient unsure if she still has a tampon stuck in her vagina.  Patient states that she went to try to remove the tampon and could not feel for the string on the tampon on Saturday.

## 2015-10-11 DIAGNOSIS — J440 Chronic obstructive pulmonary disease with acute lower respiratory infection: Secondary | ICD-10-CM | POA: Diagnosis not present

## 2015-10-11 DIAGNOSIS — Z3200 Encounter for pregnancy test, result unknown: Secondary | ICD-10-CM | POA: Diagnosis not present

## 2015-12-13 DIAGNOSIS — J019 Acute sinusitis, unspecified: Secondary | ICD-10-CM | POA: Diagnosis not present

## 2015-12-13 DIAGNOSIS — Z3200 Encounter for pregnancy test, result unknown: Secondary | ICD-10-CM | POA: Diagnosis not present

## 2015-12-13 DIAGNOSIS — J440 Chronic obstructive pulmonary disease with acute lower respiratory infection: Secondary | ICD-10-CM | POA: Diagnosis not present

## 2015-12-13 DIAGNOSIS — J209 Acute bronchitis, unspecified: Secondary | ICD-10-CM | POA: Diagnosis not present

## 2015-12-20 DIAGNOSIS — J019 Acute sinusitis, unspecified: Secondary | ICD-10-CM | POA: Diagnosis not present

## 2015-12-20 DIAGNOSIS — J209 Acute bronchitis, unspecified: Secondary | ICD-10-CM | POA: Diagnosis not present

## 2015-12-20 DIAGNOSIS — J440 Chronic obstructive pulmonary disease with acute lower respiratory infection: Secondary | ICD-10-CM | POA: Diagnosis not present

## 2016-02-02 DIAGNOSIS — Z0000 Encounter for general adult medical examination without abnormal findings: Secondary | ICD-10-CM | POA: Diagnosis not present

## 2016-05-21 DIAGNOSIS — L720 Epidermal cyst: Secondary | ICD-10-CM | POA: Diagnosis not present

## 2016-05-21 DIAGNOSIS — L728 Other follicular cysts of the skin and subcutaneous tissue: Secondary | ICD-10-CM | POA: Diagnosis not present

## 2016-05-23 DIAGNOSIS — L720 Epidermal cyst: Secondary | ICD-10-CM | POA: Diagnosis not present

## 2017-05-26 DIAGNOSIS — H5203 Hypermetropia, bilateral: Secondary | ICD-10-CM | POA: Diagnosis not present

## 2017-12-14 DIAGNOSIS — R0989 Other specified symptoms and signs involving the circulatory and respiratory systems: Secondary | ICD-10-CM | POA: Diagnosis not present

## 2017-12-14 DIAGNOSIS — Z3200 Encounter for pregnancy test, result unknown: Secondary | ICD-10-CM | POA: Diagnosis not present

## 2017-12-14 DIAGNOSIS — T189XXA Foreign body of alimentary tract, part unspecified, initial encounter: Secondary | ICD-10-CM | POA: Diagnosis not present

## 2018-01-16 DIAGNOSIS — Z0000 Encounter for general adult medical examination without abnormal findings: Secondary | ICD-10-CM | POA: Diagnosis not present

## 2018-02-20 DIAGNOSIS — J019 Acute sinusitis, unspecified: Secondary | ICD-10-CM | POA: Diagnosis not present

## 2018-02-24 ENCOUNTER — Encounter: Payer: Self-pay | Admitting: Family Medicine

## 2018-02-24 ENCOUNTER — Ambulatory Visit (INDEPENDENT_AMBULATORY_CARE_PROVIDER_SITE_OTHER): Payer: Self-pay | Admitting: Family Medicine

## 2018-02-24 VITALS — BP 100/80 | HR 79 | Temp 98.0°F | Wt 168.0 lb

## 2018-02-24 DIAGNOSIS — R05 Cough: Secondary | ICD-10-CM

## 2018-02-24 DIAGNOSIS — J019 Acute sinusitis, unspecified: Secondary | ICD-10-CM

## 2018-02-24 DIAGNOSIS — J9801 Acute bronchospasm: Secondary | ICD-10-CM

## 2018-02-24 DIAGNOSIS — R059 Cough, unspecified: Secondary | ICD-10-CM

## 2018-02-24 MED ORDER — IPRATROPIUM BROMIDE 0.03 % NA SOLN: 2.0000 | Freq: Two times a day (BID) | NASAL | 0 refills | Status: DC

## 2018-02-24 MED ORDER — DOXYCYCLINE HYCLATE 100 MG PO TABS: 100.0000 mg | ORAL_TABLET | Freq: Two times a day (BID) | ORAL | 0 refills | Status: DC

## 2018-02-24 MED ORDER — PREDNISONE 20 MG PO TABS: 40.0000 mg | ORAL_TABLET | Freq: Every day | ORAL | 0 refills | Status: AC

## 2018-02-24 MED ORDER — ALBUTEROL SULFATE 108 (90 BASE) MCG/ACT IN AEPB
2.0000 | INHALATION_SPRAY | RESPIRATORY_TRACT | 0 refills | Status: DC | PRN
Start: 2018-02-24 — End: 2018-10-24

## 2018-02-24 MED ORDER — BENZONATATE 100 MG PO CAPS: 100.0000 mg | ORAL_CAPSULE | Freq: Three times a day (TID) | ORAL | 0 refills | Status: DC | PRN

## 2018-02-24 NOTE — Progress Notes (Signed)
Patient ID: Audrey Ortega, female    DOB: 10/20/1975, 42 y.o.   MRN: [id number]  PCP: No primary care provider on file.  Chief Complaint  Patient presents with  . Sinus Problem    Subjective:  HPI Audrey Ortega is a [age] y.o. female presents for evaluation sinus problems.  She was seen 4 days ago at an urgent care and prescribed Minocycline and Guaifenesin.    SINUSITIS Onset: 9 days Facial/sinus pressure with discolored nasal mucus.    Severity: moderate Tried OTC meds without significant relief.  Symptoms:  +nasal congestion +  swollen neck glands + Sinus Headache + ear pressure +mild soreness of throat  +chest tightness and cough +facial pressure    No Abdominal Pain No Nausea No Vomiting No diarrhea  No Myalgias No focal neurologic symptoms No syncope No Rash Patient's last menstrual period was 02/16/2018.   Social History   Socioeconomic History  . Marital status: Married    Spouse name: Not on file  . Number of children: 1  . Years of education: Not on file  . Highest education level: Not on file  Occupational History  . 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 Financial resource strain: Not on file  . Food insecurity:    Worry: Not on file    Inability: Not on file  . Transportation needs:    Medical: Not on file    Non-medical: Not on file  Tobacco Use  . Smoking status: Current Every Day Smoker    Packs/day: 0.50    Years: 20.00    Pack years: 10.00    Types: Cigarettes  . Smokeless tobacco: Never Used  Substance and Sexual Activity  . Alcohol use: No  . Drug use: No  . Sexual activity: Not on file  Lifestyle  . Physical activity:    Days per week: Not on file    Minutes per session: Not on file  . Stress: Not on file  Relationships  . Social connections:    Talks on phone: Not on file    Gets together: Not on file    Attends religious service: Not on file    Active member of club  or organization: Not on file    Attends meetings of clubs or organizations: Not on file    Relationship status: Not on file  . Intimate partner violence:    Fear of current or ex partner: Not on file    Emotionally abused: Not on file    Physically abused: Not on file    Forced sexual activity: Not on file  Other Topics Concern  . Not on file  Social History Narrative  . Not on file    Family History  Problem Relation Age of Onset  . Diabetes Paternal Grandmother   . Hypertension Paternal Grandmother   . Diabetes Paternal Grandfather   . Alcohol abuse Paternal Grandfather   . Hypertension Paternal Grandfather   . Arthritis Mother   . Hypertension Mother   . Arthritis Father   . Hypertension Father   . Cancer Maternal Aunt        breast  . Alcohol abuse Maternal Grandmother    Review of Systems Remainder of Review of Systems negative except as noted in the HPI.  Patient Active Problem List   Diagnosis Date Noted  . Infected sebaceous cyst 06/09/2013  . Tobacco use disorder 12/24/2012  . Hypothyroid 12/29/2011  . Anxiety and  depression 12/29/2011    Allergies  Allergen Reactions  . Augmentin [Amoxicillin-Pot Clavulanate] Hives    Prior to Admission medications   Medication Sig Start Date End Date Taking? Authorizing Provider  fluconazole (DIFLUCAN) 150 MG tablet Take 150 mg by mouth See admin instructions. 02/20/18  Yes [provider]  minocycline (MINOCIN,DYNACIN) 100 MG capsule TAKE 1 CAPSULE BY MOUTH TWICE A DAY FOR 10 DAYS 02/20/18  Yes [provider]  ALPRAZolam (XANAX) 0.5 MG tablet Take 1 tablet (0.5 mg total) by mouth 3 (three) times daily as needed. Patient not taking: Reported on 02/24/2018 12/28/12   Sheliah Hatch, MD  FLUoxetine (PROZAC) 20 MG capsule Take 1 capsule (20 mg total) by mouth daily. Patient not taking: Reported on 02/24/2018 12/24/12   Sheliah Hatch, MD  levothyroxine (SYNTHROID, LEVOTHROID) 112 MCG tablet Take 1 tablet  (112 mcg total) by mouth daily. Patient not taking: Reported on 02/24/2018 06/10/13   Sheliah Hatch, MD  sulfamethoxazole-trimethoprim (BACTRIM DS) 800-160 MG per tablet Take 1 tablet by mouth 2 (two) times daily. Patient not taking: Reported on 02/24/2018 06/09/13   Sheliah Hatch, MD    Past Medical, Surgical Family and Social History reviewed and updated.    Objective:   Today's Vitals   02/24/18 0857  BP: 100/80  Pulse: 79  Temp: 98 F (36.7 C)  SpO2: 96%  Weight: 168 lb (76.2 kg)    Wt Readings from Last 3 Encounters:  02/24/18 168 lb (76.2 kg)  04/16/15 150 lb (68 kg)  06/09/13 163 lb 6 oz (74.1 kg)   Physical Exam  Constitutional: She appears ill.  HENT:  Head: Head is with raccoon's eyes.  Right Ear: Hearing normal. A middle ear effusion is present.  Left Ear: Hearing normal. A middle ear effusion is present.  Cardiovascular: Normal rate and regular rhythm.  Pulmonary/Chest: She has wheezes in the right upper field, the right middle field, the right lower field, the left upper field, the left middle field and the left lower field.  Significant expiratory wheezes noted throughout lung fields      Assessment & Plan:  1. Acute non-recurrent sinusitis, unspecified location -Discontinue minocycline  -Start Doxycyline x 10 days. Patient's last menstrual period was 02/16/2018. -Discontinue Mucinex -Start Claritin daily for minium of 2 weeks. -Start Atrovent 1 spray per nares BID PRN for congestions   2. Bronchospasm, hx of LABA and SABA use for management of respiratory symptoms related to chronic smoking. Will resume SABA today and recommend follow-up with PCP for evaluation of maintenance inhaler therapy.  Albuterol 2 puffs  Every 4 hours as needed for wheezing or shortness of breath Will trial a short course of prednisone 40 mg x 5 days with breakfast.  3. Cough, will trial benzonatate 100-200 mg up to 3 times daily as needed for cough.     Meds ordered  this encounter  Medications  . doxycycline (VIBRA-TABS) 100 MG tablet    Sig: Take 1 tablet (100 mg total) by mouth 2 (two) times daily.    Dispense:  20 tablet    Refill:  0  . ipratropium (ATROVENT) 0.03 % nasal spray    Sig: Place 2 sprays into both nostrils 2 (two) times daily.    Dispense:  30 mL    Refill:  0  . benzonatate (TESSALON) 100 MG capsule    Sig: Take 1-2 capsules (100-200 mg total) by mouth 3 (three) times daily as needed for cough.  Dispense:  60 capsule    Refill:  0  . Albuterol Sulfate 108 (90 Base) MCG/ACT AEPB    Sig: Inhale 2 puffs into the lungs every 4 (four) hours as needed.    Dispense:  1 each    Refill:  0  . predniSONE (DELTASONE) 20 MG tablet    Sig: Take 2 tablets (40 mg total) by mouth daily with breakfast for 5 days.    Dispense:  10 tablet    Refill:  0   If symptoms worsen or do not improve, return for follow-up, follow-up with PCP, or at the emergency department if severity of symptoms warrant a higher level of care.     Godfrey Pick. Tiburcio Pea, MSN, FNP-C [facility]  [address]  [place], [place] [address] [phone]

## 2018-02-24 NOTE — Patient Instructions (Addendum)
Start Albuterol inhaler 2 puffs every 4 hours as needed for wheezing and shortness of breath.  Start Atrovent nasal sprays 2 sprays per nares twice daily x10 days you may discontinue after you complete the antibiotic and use as needed after.  Start Doxycyline  1 tablet twice daily with food to avoid stomach upset. Complete all medication.  Start Benzonatate 100-200 mg up to 3 times daily as needed for cough.     Bronchospasm, Adult Bronchospasm is when airways in the lungs get smaller. When this happens, it can be hard to breathe. You may cough. You may also make a whistling sound when you breathe (wheeze). Follow these instructions at home: Medicines  Take over-the-counter and prescription medicines only as told by your doctor.  If you need to use an inhaler or nebulizer to take your medicine, ask your doctor how to use it.  If you were given a spacer, always use it with your inhaler. Lifestyle  Change your heating and air conditioning filter. Do this at least once a month.  Try not to use fireplaces and wood stoves.  Do not  smoke. Do not  allow smoking in your home.  Try not to use things that have a strong smell, like perfume.  Get rid of pests (such as roaches and mice) and their poop.  Remove any mold from your home.  Keep your house clean. Get rid of dust.  Use cleaning products that have no smell.  Replace carpet with wood, tile, or vinyl flooring.  Use allergy-proof pillows, mattress covers, and box spring covers.  Wash bed sheets and blankets every week. Use hot water. Dry them in a dryer.  Use blankets that are made of polyester or cotton.  Wash your hands often.  Keep pets out of your bedroom.  When you exercise, try not to breathe in cold air. General instructions  Have a plan for getting medical care. Know these things: ? When to call your doctor. ? When to call local emergency services (911 in the [place] ? Where to go in an emergency.  Stay  up to date on your shots (immunizations).  When you have an episode: ? Stay calm. ? Relax. ? Breathe slowly. Contact a doctor if:  Your muscles ache.  Your chest hurts.  The color of the mucus you cough up (sputum) changes from clear or white to yellow, green, gray, or bloody.  The mucus you cough up gets thicker.  You have a fever. Get help right away if:  The whistling sound gets worse, even after you take your medicines.  Your coughing gets worse.  You find it even harder to breathe.  Your chest hurts very much. Summary  Bronchospasm is when airways in the lungs get smaller.  When this happens, it can be hard to breathe. You may cough. You may also make a whistling sound when you breathe.  Stay away from things that cause you to have episodes. These include smoke or dust. This information is not intended to replace advice given to you by your health care provider. Make sure you discuss any questions you have with your health care provider. Document Released: 05/04/2009 Document Revised: 07/10/2016 Document Reviewed: 07/10/2016 Elsevier Interactive Patient Education  2017 [employer] Inc[employer]  Cough, Adult A cough helps to clear your throat and lungs. A cough may last only 2-3 weeks (acute), or it may last longer than 8 weeks (chronic). Many different things can cause a cough. A cough may be a sign  of an illness or another medical condition. Follow these instructions at home:  Pay attention to any changes in your cough.  Take medicines only as told by your doctor. ? If you were prescribed an antibiotic medicine, take it as told by your doctor. Do not stop taking it even if you start to feel better. ? Talk with your doctor before you try using a cough medicine.  Drink enough fluid to keep your pee (urine) clear or pale yellow.  If the air is dry, use a cold steam vaporizer or humidifier in your home.  Stay away from things that make you cough at work or at home.  If  your cough is worse at night, try using extra pillows to raise your head up higher while you sleep.  Do not smoke, and try not to be around smoke. If you need help quitting, ask your doctor.  Do not have caffeine.  Do not drink alcohol.  Rest as needed. Contact a doctor if:  You have new problems (symptoms).  You cough up yellow fluid (pus).  Your cough does not get better after 2-3 weeks, or your cough gets worse.  Medicine does not help your cough and you are not sleeping well.  You have pain that gets worse or pain that is not helped with medicine.  You have a fever.  You are losing weight and you do not know why.  You have night sweats. Get help right away if:  You cough up blood.  You have trouble breathing.  Your heartbeat is very fast. This information is not intended to replace advice given to you by your health care provider. Make sure you discuss any questions you have with your health care provider. Document Released: 03/20/2011 Document Revised: 12/13/2015 Document Reviewed: 09/13/2014 Elsevier Interactive Patient Education  2018 [employer]  Sinusitis, Adult Sinusitis is soreness and inflammation of your sinuses. Sinuses are hollow spaces in the bones around your face. They are located:  Around your eyes.  In the middle of your forehead.  Behind your nose.  In your cheekbones.  Your sinuses and nasal passages are lined with a stringy fluid (mucus). Mucus normally drains out of your sinuses. When your nasal tissues get inflamed or swollen, the mucus can get trapped or blocked so air cannot flow through your sinuses. This lets bacteria, viruses, and funguses grow, and that leads to infection. Follow these instructions at home: Medicines  Take, use, or apply over-the-counter and prescription medicines only as told by your doctor. These may include nasal sprays.  If you were prescribed an antibiotic medicine, take it as told by your doctor. Do not  stop taking the antibiotic even if you start to feel better. Hydrate and Humidify  Drink enough water to keep your pee (urine) clear or pale yellow.  Use a cool mist humidifier to keep the humidity level in your home above 50%.  Breathe in steam for 10-15 minutes, 3-4 times a day or as told by your doctor. You can do this in the bathroom while a hot shower is running.  Try not to spend time in cool or dry air. Rest  Rest as much as possible.  Sleep with your head raised (elevated).  Make sure to get enough sleep each night. General instructions  Put a warm, moist washcloth on your face 3-4 times a day or as told by your doctor. This will help with discomfort.  Wash your hands often with soap and water. If there  is no soap and water, use hand sanitizer.  Do not smoke. Avoid being around people who are smoking (secondhand smoke).  Keep all follow-up visits as told by your doctor. This is important. Contact a doctor if:  You have a fever.  Your symptoms get worse.  Your symptoms do not get better within 10 days. Get help right away if:  You have a very bad headache.  You cannot stop throwing up (vomiting).  You have pain or swelling around your face or eyes.  You have trouble seeing.  You feel confused.  Your neck is stiff.  You have trouble breathing. This information is not intended to replace advice given to you by your health care provider. Make sure you discuss any questions you have with your health care provider. Document Released: 12/24/2007 Document Revised: 03/02/2016 Document Reviewed: 05/02/2015 Elsevier Interactive Patient Education  [employer].

## 2018-02-26 ENCOUNTER — Telehealth: Payer: Self-pay | Admitting: Emergency Medicine

## 2018-02-26 NOTE — Telephone Encounter (Signed)
Left message follow up call for visit with [facility] 

## 2018-10-15 DIAGNOSIS — L089 Local infection of the skin and subcutaneous tissue, unspecified: Secondary | ICD-10-CM | POA: Diagnosis not present

## 2018-10-15 DIAGNOSIS — L723 Sebaceous cyst: Secondary | ICD-10-CM | POA: Diagnosis not present

## 2018-10-15 DIAGNOSIS — L0291 Cutaneous abscess, unspecified: Secondary | ICD-10-CM | POA: Diagnosis not present

## 2018-10-24 ENCOUNTER — Other Ambulatory Visit: Payer: Self-pay

## 2018-10-24 ENCOUNTER — Ambulatory Visit (INDEPENDENT_AMBULATORY_CARE_PROVIDER_SITE_OTHER): Payer: 59

## 2018-10-24 ENCOUNTER — Encounter: Payer: Self-pay | Admitting: Gynecology

## 2018-10-24 ENCOUNTER — Ambulatory Visit
Admission: EM | Admit: 2018-10-24 | Discharge: 2018-10-24 | Disposition: A | Discharge status: Home / Self Care | Payer: 59 | Attending: Family Medicine | Admitting: Family Medicine

## 2018-10-24 DIAGNOSIS — R059 Cough, unspecified: Secondary | ICD-10-CM

## 2018-10-24 DIAGNOSIS — R05 Cough: Secondary | ICD-10-CM | POA: Diagnosis not present

## 2018-10-24 DIAGNOSIS — R0789 Other chest pain: Secondary | ICD-10-CM

## 2018-10-24 DIAGNOSIS — F17200 Nicotine dependence, unspecified, uncomplicated: Secondary | ICD-10-CM | POA: Diagnosis not present

## 2018-10-24 MED ORDER — ALBUTEROL SULFATE 108 (90 BASE) MCG/ACT IN AEPB: 2.0000 | INHALATION_SPRAY | RESPIRATORY_TRACT | 0 refills | Status: DC | PRN

## 2018-10-24 MED ORDER — PREDNISONE 10 MG (21) PO TBPK: ORAL_TABLET | ORAL | 0 refills | Status: DC

## 2018-10-24 NOTE — ED Provider Notes (Signed)
[facility]    CSN: [id number] Arrival date & time: 10/24/18  1217     History   Chief Complaint No chief complaint on file.   HPI Audrey Ortega is a 43 y.o. female.   43 year old female presents with cough for the past 2-3 weeks. Started with some nasal congestion and chest congestion but nasal congestion has improved. Now having more post nasal drainage and chest tightness. Denies any fever, vomiting or diarrhea. Took Alka Seltzer at first with no relief but has taken Claritin and Mucinex the past few days with some success. Does smoke daily. Had gone to the Dermatologist about 1 week ago for a infected cyst and was placed on Doxycycline. Also has Atrovent nasal spray to help with congestion. No longer has any inhalers. No recent travel or known exposure to COVID-19. No other family members ill.   The history is provided by the patient.    Past Medical History:  Diagnosis Date  . Anxiety   . Chicken pox   . Depression   . Emphysema of lung (HCC)   . Frequent headaches   . Hypothyroidism   . Migraine     Patient Active Problem List   Diagnosis Date Noted  . Infected sebaceous cyst 06/09/2013  . Tobacco use disorder 12/24/2012  . Hypothyroid 12/29/2011  . Anxiety and depression 12/29/2011    History reviewed. No pertinent surgical history.  OB History   No obstetric history on file.      Home Medications    Prior to Admission medications   Medication Sig Start Date End Date Taking? Authorizing Provider  doxycycline (VIBRA-TABS) 100 MG tablet Take 1 tablet (100 mg total) by mouth 2 (two) times daily. 02/24/18  Yes Bing Neighbors, FNP  ipratropium (ATROVENT) 0.03 % nasal spray Place 2 sprays into both nostrils 2 (two) times daily. 02/24/18  Yes Bing Neighbors, FNP  Albuterol Sulfate (PROAIR RESPICLICK) 108 (90 Base) MCG/ACT AEPB Inhale 2 puffs into the lungs every 4 (four) hours as needed. 10/24/18   Sudie Grumbling, NP  benzonatate (TESSALON) 100  MG capsule Take 1-2 capsules (100-200 mg total) by mouth 3 (three) times daily as needed for cough. 02/24/18   Bing Neighbors, FNP  levothyroxine (SYNTHROID, LEVOTHROID) 112 MCG tablet Take 1 tablet (112 mcg total) by mouth daily. Patient not taking: Reported on 02/24/2018 06/10/13   Sheliah Hatch, MD  predniSONE (STERAPRED UNI-PAK 21 TAB) 10 MG (21) TBPK tablet Take 6 tabs by mouth daily the first day then decrease by 1 tablet each day until finished on day 6. 10/24/18   Kesean Serviss, Ali Lowe, NP    Family History Family History  Problem Relation Age of Onset  . Arthritis Mother   . Hypertension Mother   . Arthritis Father   . Hypertension Father   . Diabetes Paternal Grandmother   . Hypertension Paternal Grandmother   . Diabetes Paternal Grandfather   . Alcohol abuse Paternal Grandfather   . Hypertension Paternal Grandfather   . Cancer Maternal Aunt        breast  . Alcohol abuse Maternal Grandmother     Social History Social History   Tobacco Use  . Smoking status: Current Every Day Smoker    Packs/day: 0.50    Years: 20.00    Pack years: 10.00    Types: Cigarettes  . Smokeless tobacco: Never Used  Substance Use Topics  . Alcohol use: No  . Drug use: No  Allergies   Augmentin [amoxicillin-pot clavulanate]   Review of Systems Review of Systems  Constitutional: Positive for appetite change. Negative for activity change, chills, diaphoresis, fatigue and fever.  HENT: Positive for congestion (mostly chest), postnasal drip, sinus pressure and sore throat (irritated from coughing). Negative for ear discharge, ear pain, facial swelling, mouth sores, nosebleeds, rhinorrhea, sinus pain, sneezing and trouble swallowing.   Eyes: Negative for pain, discharge, redness and itching.  Respiratory: Positive for cough and chest tightness. Negative for shortness of breath and wheezing.   Cardiovascular: Negative for chest pain.  Gastrointestinal: Positive for nausea. Negative for  diarrhea and vomiting.  Musculoskeletal: Negative for arthralgias, myalgias, neck pain and neck stiffness.  Skin: Negative for color change, rash and wound.  Neurological: Negative for dizziness, tremors, seizures, syncope, weakness, light-headedness, numbness and headaches.  Hematological: Negative for adenopathy. Does not bruise/bleed easily.     Physical Exam Triage Vital Signs ED Triage Vitals  Enc Vitals Group     BP 10/24/18 1228 120/63     Pulse Rate 10/24/18 1228 80     Resp 10/24/18 1228 16     Temp 10/24/18 1228 98.1 F (36.7 C)     Temp Source 10/24/18 1228 Oral     SpO2 10/24/18 1228 98 %     Weight 10/24/18 1230 170 lb (77.1 kg)     Height 10/24/18 1230 5\' 7"  (1.702 m)     Head Circumference --      Peak Flow --      Pain Score 10/24/18 1230 0     Pain Loc --      Pain Edu? --      Excl. in GC? --    No data found.  Updated Vital Signs BP 120/63 (BP Location: Left Arm)   Pulse 80   Temp 98.1 F (36.7 C) (Oral)   Resp 16   Ht 5\' 7"  (1.702 m)   Wt 170 lb (77.1 kg)   LMP 10/10/2018   SpO2 98%   BMI 26.63 kg/m   Visual Acuity Right Eye Distance:   Left Eye Distance:   Bilateral Distance:    Right Eye Near:   Left Eye Near:    Bilateral Near:     Physical Exam Vitals signs and nursing note reviewed.  Constitutional:      General: She is awake. She is not in acute distress.    Appearance: She is well-developed and well-groomed. She is not ill-appearing.     Comments: Patient sitting comfortably on exam table in no acute distress.   HENT:     Head: Normocephalic and atraumatic.     Right Ear: Hearing, tympanic membrane, ear canal and external ear normal.     Left Ear: Hearing, tympanic membrane, ear canal and external ear normal.     Nose: Congestion present. No rhinorrhea.     Right Sinus: Maxillary sinus tenderness present. No frontal sinus tenderness.     Left Sinus: Maxillary sinus tenderness present. No frontal sinus tenderness.      Mouth/Throat:     Lips: Pink.     Mouth: Mucous membranes are moist.     Pharynx: Uvula midline. Oropharyngeal exudate (clear to yellowish post nasal drainage present) and posterior oropharyngeal erythema present. No pharyngeal swelling.  Eyes:     Extraocular Movements: Extraocular movements intact.     Conjunctiva/sclera: Conjunctivae normal.  Neck:     Musculoskeletal: Normal range of motion and neck supple. No neck rigidity.  Cardiovascular:  Rate and Rhythm: Normal rate and regular rhythm.     Heart sounds: Normal heart sounds. No murmur.  Pulmonary:     Effort: Pulmonary effort is normal. No respiratory distress.     Breath sounds: Normal air entry. No decreased air movement. Examination of the right-upper field reveals decreased breath sounds and wheezing. Examination of the left-upper field reveals decreased breath sounds and wheezing. Decreased breath sounds and wheezing present. No rhonchi or rales.  Musculoskeletal: Normal range of motion.  Lymphadenopathy:     Cervical: Cervical adenopathy present.     Right cervical: Superficial cervical adenopathy (and mildly tender) present.     Left cervical: Superficial cervical adenopathy (and mildly tender) present.  Skin:    General: Skin is warm and dry.     Capillary Refill: Capillary refill takes less than 2 seconds.  Neurological:     General: No focal deficit present.     Mental Status: She is alert and oriented to person, place, and time.  Psychiatric:        Mood and Affect: Mood normal.        Behavior: Behavior normal. Behavior is cooperative.      UC Treatments / Results  Labs (all labs ordered are listed, but only abnormal results are displayed) Labs Reviewed - No data to display  EKG None  Radiology Dg Chest 2 View  Result Date: 10/24/2018 CLINICAL DATA:  Cough for 3 weeks EXAM: CHEST - 2 VIEW COMPARISON:  08/26/2014 FINDINGS: The heart size and mediastinal contours are within normal limits. Both lungs are  clear. The visualized skeletal structures are unremarkable. IMPRESSION: No active cardiopulmonary disease. Electronically Signed   By: Elige Ko   On: 10/24/2018 13:24    Procedures Procedures (including critical care time)  Medications Ordered in UC Medications - No data to display  Initial Impression / Assessment and Plan / UC Course  I have reviewed the triage vital signs and the nursing notes.  Pertinent labs & imaging results that were available during my care of the patient were reviewed by me and considered in my medical decision making (see chart for details).    Reviewed negative chest x-ray results with patient. Discussed that she probably has a viral illness or irritated cough from environmental factors. Recommend continue Doxycycline as directed (has 4 more days). Add Prednisone  6 day dose pack as directed. Continue to use Mucinex as directed. May use Albuterol inhaler 2 puffs every 4 to 6 hours as needed. Increase fluids to help loosen up mucus. Encouraged to decrease smoking. Discussed that if she needs to have a nebulizer treatment- she will need to go to the ER since we are no longer providing treatments here at Urgent Care. Or we can prescribe a nebulizer treatment to do at home if needed. Follow-up in 4 to 5 days if not improving or sooner if worsening.  Final Clinical Impressions(s) / UC Diagnoses   Final diagnoses:  Cough  Chest tightness  Current every day smoker     Discharge Instructions     Recommend finish Doxycycline as directed. Add Prednisone  - take 6 tablets by mouth today then decrease by 1 tablet each day until finished on day 6. Continue Mucinex as directed. May use Albuterol inhaler 2 puffs every 4 to 6 hours as needed. Continue to push fluids to help loosen up mucus. Follow-up here in 4 to 5 days if not improving.     ED Prescriptions    Medication Sig Dispense  Auth. Provider   Albuterol Sulfate (PROAIR RESPICLICK) 108 (90 Base) MCG/ACT  AEPB Inhale 2 puffs into the lungs every 4 (four) hours as needed. 1 each Sudie GrumblingAmyot, Kinleigh Nault Berry, NP   predniSONE (STERAPRED UNI-PAK 21 TAB) 10 MG (21) TBPK tablet Take 6 tabs by mouth daily the first day then decrease by 1 tablet each day until finished on day 6. 21 tablet [place] Yeats, Ali LoweAnn Berry, NP     Controlled Substance Prescriptions [place] Controlled Substance Registry consulted? Not Applicable   Sudie Grumblingmyot, Shaneca Orne Berry, NP 10/24/18 1400

## 2018-10-24 NOTE — Discharge Instructions (Addendum)
Recommend finish Doxycycline as directed. Add Prednisone 10mg  - take 6 tablets by mouth today then decrease by 1 tablet each day until finished on day 6. Continue Mucinex as directed. May use Albuterol inhaler 2 puffs every 4 to 6 hours as needed. Continue to push fluids to help loosen up mucus. Follow-up here in 4 to 5 days if not improving.

## 2018-10-24 NOTE — ED Triage Notes (Signed)
Patient c/o cough x few weeks ago. Per patient on antibiotic for cyst excision.

## 2018-11-23 DIAGNOSIS — L723 Sebaceous cyst: Secondary | ICD-10-CM | POA: Diagnosis not present

## 2018-11-23 DIAGNOSIS — L719 Rosacea, unspecified: Secondary | ICD-10-CM | POA: Diagnosis not present

## 2019-02-10 ENCOUNTER — Other Ambulatory Visit: Payer: Self-pay

## 2019-02-10 DIAGNOSIS — Z20822 Contact with and (suspected) exposure to covid-19: Secondary | ICD-10-CM

## 2019-02-23 ENCOUNTER — Other Ambulatory Visit: Payer: Self-pay

## 2019-02-23 ENCOUNTER — Ambulatory Visit
Admission: EM | Admit: 2019-02-23 | Discharge: 2019-02-23 | Disposition: A | Discharge status: Home / Self Care | Payer: 59 | Attending: Family Medicine | Admitting: Family Medicine

## 2019-02-23 DIAGNOSIS — F17200 Nicotine dependence, unspecified, uncomplicated: Secondary | ICD-10-CM

## 2019-02-23 DIAGNOSIS — R062 Wheezing: Secondary | ICD-10-CM | POA: Diagnosis not present

## 2019-02-23 MED ORDER — ALBUTEROL SULFATE HFA 108 (90 BASE) MCG/ACT IN AERS: 1.0000 | INHALATION_SPRAY | Freq: Four times a day (QID) | RESPIRATORY_TRACT | 6 refills | Status: DC | PRN

## 2019-02-23 MED ORDER — BUDESONIDE-FORMOTEROL FUMARATE 160-4.5 MCG/ACT IN AERO: 2.0000 | INHALATION_SPRAY | Freq: Two times a day (BID) | RESPIRATORY_TRACT | 12 refills | Status: DC

## 2019-02-23 NOTE — ED Triage Notes (Signed)
Pt does not have a primary care but she is working on it. Here for refill of inhaler but didn't like the powder inhaler. Needs refill but does smoke. Has a slight cough, wheezing in chest and unsure if she has asthma or copd

## 2019-02-23 NOTE — Discharge Instructions (Signed)
Medication as prescribed.  Establish with primary care.  Take care  Dr. Lacinda Axon

## 2019-02-23 NOTE — ED Provider Notes (Signed)
[facility]    CSN: [id number] Arrival date & time: 02/23/19  1551  History   Chief Complaint Chief Complaint  Patient presents with  . Medication Refill   HPI   43 year old female presents with the above complaint.  Patient reports intermittent cough and wheezing.  Patient is a smoker.  Patient states that she has had inhalers in the past to help with her symptoms.  Patient requesting Symbicort and albuterol today.  She is currently feeling well.  She does note some recent wheezing.  Continues to smoke.  Has smoked heavily recently.  States that she is not ready to quit smoking at this time.  Has not established with a PCP yet.  She states that she will do so in the near future.  No fever.  No other associated symptoms.  No other complaints.  PMH, Surgical Hx, Family Hx, Social History reviewed and updated as below.  Past Medical History:  Diagnosis Date  . Anxiety   . Chicken pox   . Depression   . Frequent headaches   . Hypothyroidism   . Migraine    Patient Active Problem List   Diagnosis Date Noted  . Infected sebaceous cyst 06/09/2013  . Tobacco use disorder 12/24/2012  . Hypothyroid 12/29/2011  . Anxiety and depression 12/29/2011   OB History   No obstetric history on file.      Home Medications    Prior to Admission medications   Medication Sig Start Date End Date Taking? Authorizing Provider  minocycline (MINOCIN) 50 MG capsule Take 50 mg by mouth 2 (two) times daily. 12/20/18  Yes [provider]  albuterol (VENTOLIN HFA) 108 (90 Base) MCG/ACT inhaler Inhale 1-2 puffs into the lungs every 6 (six) hours as needed for wheezing or shortness of breath. 02/23/19   Tommie Samsook, Parys Elenbaas G, DO  budesonide-formoterol (SYMBICORT) 160-4.5 MCG/ACT inhaler Inhale 2 puffs into the lungs 2 (two) times daily. 02/23/19   Everlene Otherook, Blimi Godby G, DO  ipratropium (ATROVENT) 0.03 % nasal spray Place 2 sprays into both nostrils 2 (two) times daily. 02/24/18   Bing NeighborsHarris, Kimberly S, FNP   levothyroxine (SYNTHROID, LEVOTHROID) 112 MCG tablet Take 1 tablet (112 mcg total) by mouth daily. 06/10/13   Sheliah Hatchabori, Katherine E, MD    Family History Family History  Problem Relation Age of Onset  . Arthritis Mother   . Hypertension Mother   . Arthritis Father   . Hypertension Father   . Diabetes Paternal Grandmother   . Hypertension Paternal Grandmother   . Diabetes Paternal Grandfather   . Alcohol abuse Paternal Grandfather   . Hypertension Paternal Grandfather   . Cancer Maternal Aunt        breast  . Alcohol abuse Maternal Grandmother     Social History Social History   Tobacco Use  . Smoking status: Current Every Day Smoker    Packs/day: 0.50    Years: 20.00    Pack years: 10.00    Types: Cigarettes  . Smokeless tobacco: Never Used  Substance Use Topics  . Alcohol use: No  . Drug use: No     Allergies   Augmentin [amoxicillin-pot clavulanate]   Review of Systems Review of Systems  Constitutional: Negative.   Respiratory: Positive for cough and wheezing.    Physical Exam Triage Vital Signs ED Triage Vitals  Enc Vitals Group     BP 02/23/19 1630 129/81     Pulse Rate 02/23/19 1630 77     Resp 02/23/19 1630 18  Temp 02/23/19 1630 98.1 F (36.7 C)     Temp Source 02/23/19 1630 Oral     SpO2 02/23/19 1630 100 %     Weight 02/23/19 1631 175 lb (79.4 kg)     Height --      Head Circumference --      Peak Flow --      Pain Score 02/23/19 1630 0     Pain Loc --      Pain Edu? --      Excl. in [place]? --    Updated Vital Signs BP 129/81 (BP Location: Right Arm)   Pulse 77   Temp 98.1 F (36.7 C) (Oral)   Resp 18   Wt 79.4 kg   LMP 02/23/2019 (Exact Date)   SpO2 100%   BMI 27.41 kg/m   Visual Acuity Right Eye Distance:   Left Eye Distance:   Bilateral Distance:    Right Eye Near:   Left Eye Near:    Bilateral Near:     Physical Exam Vitals signs and nursing note reviewed.  Constitutional:      General: She is not in acute distress.     Appearance: Normal appearance.  HENT:     Head: Normocephalic and atraumatic.  Eyes:     General:        Right eye: No discharge.        Left eye: No discharge.     Conjunctiva/sclera: Conjunctivae normal.  Cardiovascular:     Rate and Rhythm: Normal rate and regular rhythm.  Pulmonary:     Effort: Pulmonary effort is normal.     Breath sounds: Normal breath sounds. No wheezing, rhonchi or rales.  Neurological:     Mental Status: She is alert.     Deep Tendon Reflexes: Reflexes normal.  Psychiatric:        Mood and Affect: Mood normal.        Behavior: Behavior normal.    UC Treatments / Results  Labs (all labs ordered are listed, but only abnormal results are displayed) Labs Reviewed - No data to display  EKG   Radiology No results found.  Procedures Procedures (including critical care time)  Medications Ordered in UC Medications - No data to display  Initial Impression / Assessment and Plan / UC Course  I have reviewed the triage vital signs and the nursing notes.  Pertinent labs & imaging results that were available during my care of the patient were reviewed by me and considered in my medical decision making (see chart for details).    43 year old female presents with cough and wheezing.  Restarting Symbicort and albuterol.  Needs primary care follow-up.  Final Clinical Impressions(s) / UC Diagnoses   Final diagnoses:  Wheezing  Tobacco use disorder     Discharge Instructions     Medication as prescribed.  Establish with primary care.  Take care  Dr. Lacinda Axon    ED Prescriptions    Medication Sig Dispense Auth. Provider   albuterol (VENTOLIN HFA) 108 (90 Base) MCG/ACT inhaler Inhale 1-2 puffs into the lungs every 6 (six) hours as needed for wheezing or shortness of breath. 18 g Shireen Rayburn G, DO   budesonide-formoterol (SYMBICORT) 160-4.5 MCG/ACT inhaler Inhale 2 puffs into the lungs 2 (two) times daily. [place], DO      Controlled Substance Prescriptions [place] Controlled Substance Registry consulted? Not Applicable   Coral Spikes, DO 02/23/19 1840

## 2019-03-16 ENCOUNTER — Encounter: Payer: Self-pay | Admitting: Physician Assistant

## 2019-03-18 ENCOUNTER — Other Ambulatory Visit: Payer: Self-pay

## 2019-03-18 ENCOUNTER — Encounter: Payer: Self-pay | Admitting: Physician Assistant

## 2019-03-18 ENCOUNTER — Ambulatory Visit (INDEPENDENT_AMBULATORY_CARE_PROVIDER_SITE_OTHER): Payer: 59 | Admitting: Physician Assistant

## 2019-03-18 VITALS — Temp 97.2°F

## 2019-03-18 DIAGNOSIS — R5382 Chronic fatigue, unspecified: Secondary | ICD-10-CM | POA: Diagnosis not present

## 2019-03-18 DIAGNOSIS — F419 Anxiety disorder, unspecified: Secondary | ICD-10-CM

## 2019-03-18 DIAGNOSIS — F329 Major depressive disorder, single episode, unspecified: Secondary | ICD-10-CM

## 2019-03-18 MED ORDER — FLUOXETINE HCL 20 MG PO TABS: ORAL_TABLET | ORAL | 3 refills | Status: DC

## 2019-03-18 NOTE — Progress Notes (Signed)
Virtual Visit via Video   I connected with patient on 03/18/19 at  3:00 PM EDT by a video enabled telemedicine application and verified that I am speaking with the correct person using two identifiers.  Location patient: Home Location provider: [facility], Office Persons participating in the virtual visit: Patient, Provider, Audrey (Audrey Ortega)  I discussed the limitations of evaluation and management by telemedicine and the availability of in person appointments. The patient expressed understanding and agreed to proceed.  Subjective:   HPI:   Patient presents via [facility] today to establish care. Has not has a PCP in several years.   Patient c/o fatigue over the past several months. Keeps very busy with work, taking work home a lot. Notes when she sits down she immediately crashes. Sleep is good but does not feel restorative. Notes anhedonia and depressed mood, recurring since her brother died in August 26, 2017. Daughter recently left for college which has been hard on her. Sleeping well but only restorative on occasion. Notes she is a worry wart and has a hard time cutting this off. Notes some tearful affect. Denies change to diet. Has remote history of hypothyroidism, initially on synthroid but as taken off with normal TSH levels. No medication in some time. Denies any known history of anemia or vitamin deficiency. Notes issue with constipation over the past month of so. Denies skin changes or hair thinning. Denies noted cold intolerance. Has history of depression and was on Fluoxetine 40 mg daily.  Patient notes this regimen was helpful while she was taking.  Note she stopped this previously due to pressure from her mother and religious convictions.  Would like to consider restarting medication.  Of note has never gone to grief counseling since the death of her brother.  Denies suicidal thought or ideation.   ROS:   Review of Systems  Constitutional: Positive for malaise/fatigue.  Negative for fever and weight loss.  Eyes: Negative for blurred vision and double vision.  Respiratory: Negative for sputum production and shortness of breath.   Cardiovascular: Negative for chest pain and palpitations.  Neurological: Negative for dizziness and loss of consciousness.  Psychiatric/Behavioral: Positive for depression. Negative for hallucinations, memory loss, substance abuse and suicidal ideas. The patient is nervous/anxious. The patient does not have insomnia.      Patient Active Problem List   Diagnosis Date Noted  . Infected sebaceous cyst 06/09/2013  . Tobacco use disorder 12/24/2012  . Hypothyroid 12/29/2011  . Anxiety and depression 12/29/2011    Social History   Tobacco Use  . Smoking status: Current Every Day Smoker    Packs/day: 0.50    Years: 20.00    Pack years: 10.00    Types: Cigarettes  . Smokeless tobacco: Never Used  Substance Use Topics  . Alcohol use: No    Current Outpatient Medications:  .  albuterol (VENTOLIN HFA) 108 (90 Base) MCG/ACT inhaler, Inhale 1-2 puffs into the lungs every 6 (six) hours as needed for wheezing or shortness of breath., Disp: 18 g, Rfl: 6 .  budesonide-formoterol (SYMBICORT) 160-4.5 MCG/ACT inhaler, Inhale 2 puffs into the lungs 2 (two) times daily., Disp: 1 Inhaler, Rfl: 12 .  ipratropium (ATROVENT) 0.03 % nasal spray, Place 2 sprays into both nostrils 2 (two) times daily., Disp: 30 mL, Rfl: 0 .  levothyroxine (SYNTHROID, LEVOTHROID) 112 MCG tablet, Take 1 tablet (112 mcg total) by mouth daily., Disp: 30 tablet, Rfl: 3 .  minocycline (MINOCIN) 50 MG capsule, Take 50 mg by mouth  2 (two) times daily., Disp: , Rfl:   Allergies  Allergen Reactions  . Augmentin [Amoxicillin-Pot Clavulanate] Hives    Objective:   LMP 02/23/2019 (Exact Date)   Patient is well-developed, well-nourished in no acute distress.  Resting comfortably at home.  Head is normocephalic, atraumatic.  No labored breathing.  Speech is clear and  coherent with logical content.  Patient is alert and oriented at baseline.   Assessment and Plan:   1. Anxiety and depression Patient with longstanding history of anxiety and depression, previously on medication but has been off of this for many years.  Felt that she was doing well up until the past year.  Has had a very hard time dealing with the death of her brother to the point where this provider would consider this a prolonged grief reaction.  Discussed need for treatment giving the impact on quality of her life.  She agrees to treatment.  Treatment options including counseling, medication, combination therapies reviewed with patient.  She is interested in counseling.  Numbers given for our counseling services.  Handout sent to her my chart.  She is agreeable to restart medication.  Will restart fluoxetine at 10 mg once daily for 1 week, then increasing to 20 mg once daily.  In office follow-up scheduled within the next few weeks so that we can make sure they are not other potential causes of her anxiety and mood fluctuations. - FLUoxetine (PROZAC) 20 MG tablet; Take 10 mg once daily x 1 week. Then increase to 20 mg daily.  Dispense: 30 tablet; Refill: 3  2. Chronic fatigue Unspecified.  Suspect mainly due to level of anxiety and depression which we are treating but other underlying causes need to be ruled out.  An office appointment scheduled for examination and lab work to assess further.    Piedad ClimesWilliam Cody Gianfranco Araki, [place]PA-C 03/18/2019

## 2019-03-18 NOTE — Progress Notes (Signed)
I have discussed the procedure for the virtual visit with the patient who has given consent to proceed with assessment and treatment.   Alyssah Algeo S Janiah Devinney, CMA     

## 2019-03-18 NOTE — Patient Instructions (Signed)
Instructions sent to MyChart  Please start the Fluoxetine, taking 1/2 tablet once daily for 1 week. Then increase to 1 tablet daily.  Give a call you our counseling department at [phone] to schedule an appointment. You can consider calling your EAP as well.       Some counselors that I would recommend are Terri Bauert, Dr. Laroy Apple, Dr. Irven Shelling or Marya Amsler.   Follow-up with me in 2 weeks for a physical in office with labs and so we can fatigue further.  It was very nice meeting you today. Welcome to [facility]!

## 2019-05-09 ENCOUNTER — Other Ambulatory Visit: Payer: Self-pay | Admitting: Physician Assistant

## 2019-05-09 DIAGNOSIS — F329 Major depressive disorder, single episode, unspecified: Secondary | ICD-10-CM

## 2019-05-09 DIAGNOSIS — F32A Depression, unspecified: Secondary | ICD-10-CM

## 2019-05-09 DIAGNOSIS — F419 Anxiety disorder, unspecified: Secondary | ICD-10-CM

## 2019-07-07 ENCOUNTER — Other Ambulatory Visit: Payer: Self-pay

## 2019-07-07 ENCOUNTER — Ambulatory Visit: Payer: 59 | Attending: Internal Medicine

## 2019-07-07 DIAGNOSIS — Z20828 Contact with and (suspected) exposure to other viral communicable diseases: Secondary | ICD-10-CM | POA: Diagnosis not present

## 2019-07-07 DIAGNOSIS — Z20822 Contact with and (suspected) exposure to covid-19: Secondary | ICD-10-CM

## 2019-07-09 LAB — NOVEL CORONAVIRUS, NAA: SARS-CoV-2, NAA: NOT DETECTED

## 2020-05-07 ENCOUNTER — Ambulatory Visit: Payer: Self-pay

## 2020-05-23 DIAGNOSIS — H04123 Dry eye syndrome of bilateral lacrimal glands: Secondary | ICD-10-CM | POA: Diagnosis not present

## 2020-05-23 DIAGNOSIS — H5203 Hypermetropia, bilateral: Secondary | ICD-10-CM | POA: Diagnosis not present

## 2020-06-17 ENCOUNTER — Other Ambulatory Visit: Payer: Self-pay | Admitting: Family Medicine

## 2020-10-22 DIAGNOSIS — H1045 Other chronic allergic conjunctivitis: Secondary | ICD-10-CM | POA: Diagnosis not present

## 2020-10-22 DIAGNOSIS — H16143 Punctate keratitis, bilateral: Secondary | ICD-10-CM | POA: Diagnosis not present

## 2020-11-19 ENCOUNTER — Telehealth: Payer: Self-pay

## 2020-11-19 NOTE — Telephone Encounter (Signed)
Unable to LVM advising to do TOC. 

## 2021-01-04 ENCOUNTER — Ambulatory Visit
Admission: RE | Admit: 2021-01-04 | Discharge: 2021-01-04 | Disposition: A | Discharge status: Home / Self Care | Payer: 59 | Source: Ambulatory Visit | Attending: Emergency Medicine | Admitting: Emergency Medicine

## 2021-01-04 ENCOUNTER — Ambulatory Visit (INDEPENDENT_AMBULATORY_CARE_PROVIDER_SITE_OTHER): Payer: 59

## 2021-01-04 ENCOUNTER — Other Ambulatory Visit: Payer: Self-pay

## 2021-01-04 VITALS — BP 108/60 | HR 70 | Temp 98.3°F | Resp 14 | Ht 67.0 in | Wt 175.0 lb

## 2021-01-04 DIAGNOSIS — J449 Chronic obstructive pulmonary disease, unspecified: Secondary | ICD-10-CM | POA: Insufficient documentation

## 2021-01-04 DIAGNOSIS — R109 Unspecified abdominal pain: Secondary | ICD-10-CM | POA: Diagnosis not present

## 2021-01-04 DIAGNOSIS — G43909 Migraine, unspecified, not intractable, without status migrainosus: Secondary | ICD-10-CM | POA: Insufficient documentation

## 2021-01-04 DIAGNOSIS — R1013 Epigastric pain: Secondary | ICD-10-CM

## 2021-01-04 DIAGNOSIS — R10816 Epigastric abdominal tenderness: Secondary | ICD-10-CM | POA: Insufficient documentation

## 2021-01-04 DIAGNOSIS — R1011 Right upper quadrant pain: Secondary | ICD-10-CM | POA: Diagnosis not present

## 2021-01-04 LAB — COMPREHENSIVE METABOLIC PANEL
ALT: 11 U/L (ref 0–44)
AST: 14 U/L — ABNORMAL LOW (ref 15–41)
Albumin: 4.1 g/dL (ref 3.5–5.0)
Alkaline Phosphatase: 41 U/L (ref 38–126)
Anion gap: 7 (ref 5–15)
BUN: 6 mg/dL (ref 6–20)
CO2: 26 mmol/L (ref 22–32)
Calcium: 9.4 mg/dL (ref 8.9–10.3)
Chloride: 102 mmol/L (ref 98–111)
Creatinine, Ser: 0.6 mg/dL (ref 0.44–1.00)
GFR, Estimated: >60 mL/min (ref >60)
Glucose, Bld: 88 mg/dL (ref 70–99)
Potassium: 3.9 mmol/L (ref 3.5–5.1)
Sodium: 135 mmol/L (ref 135–145)
Total Bilirubin: 1.1 mg/dL (ref 0.3–1.2)
Total Protein: 7.2 g/dL (ref 6.5–8.1)

## 2021-01-04 LAB — CBC WITH DIFFERENTIAL/PLATELET
Abs Immature Granulocytes: 0.08 10*3/uL — ABNORMAL HIGH (ref 0.00–0.07)
Basophils Absolute: 0.1 10*3/uL (ref 0.0–0.1)
Basophils Relative: 0 %
Eosinophils Absolute: 0.1 10*3/uL (ref 0.0–0.5)
Eosinophils Relative: 1 %
HCT: 37 % (ref 36.0–46.0)
Hemoglobin: 12.7 g/dL (ref 12.0–15.0)
Immature Granulocytes: 1 %
Lymphocytes Relative: 15 %
Lymphs Abs: 2.7 10*3/uL (ref 0.7–4.0)
MCH: 30.8 pg (ref 26.0–34.0)
MCHC: 34.3 g/dL (ref 30.0–36.0)
MCV: 89.6 fL (ref 80.0–100.0)
Monocytes Absolute: 1 10*3/uL (ref 0.1–1.0)
Monocytes Relative: 6 %
Neutro Abs: 13.5 10*3/uL — ABNORMAL HIGH (ref 1.7–7.7)
Neutrophils Relative %: 77 %
Platelets: 308 10*3/uL (ref 150–400)
RBC: 4.13 MIL/uL (ref 3.87–5.11)
RDW: 13.5 % (ref 11.5–15.5)
WBC: 17.4 10*3/uL — ABNORMAL HIGH (ref 4.0–10.5)
nRBC: 0 % (ref 0.0–0.2)

## 2021-01-04 LAB — URINALYSIS, COMPLETE (UACMP) WITH MICROSCOPIC
Bilirubin Urine: NEGATIVE
Glucose, UA: NEGATIVE mg/dL
Hgb urine dipstick: NEGATIVE
Ketones, ur: NEGATIVE mg/dL
Leukocytes,Ua: NEGATIVE
Nitrite: NEGATIVE
Protein, ur: NEGATIVE mg/dL
Specific Gravity, Urine: <1.005 — ABNORMAL LOW (ref 1.005–1.030)
pH: 6 (ref 5.0–8.0)

## 2021-01-04 LAB — LIPASE, BLOOD: Lipase: 29 U/L (ref 11–51)

## 2021-01-04 LAB — PREGNANCY, URINE: Preg Test, Ur: NEGATIVE

## 2021-01-04 MED ORDER — PANTOPRAZOLE SODIUM 20 MG PO TBEC: 20.0000 mg | DELAYED_RELEASE_TABLET | Freq: Every day | ORAL | 1 refills | Status: DC

## 2021-01-04 MED ORDER — ONDANSETRON 8 MG PO TBDP: 8.0000 mg | ORAL_TABLET | Freq: Three times a day (TID) | ORAL | 0 refills | Status: DC | PRN

## 2021-01-04 NOTE — ED Triage Notes (Signed)
Patient states that after she ate last night she started having RUQ abdominal pain.  Patient states that she did have some nausea with the pain.  Patient denies V/D.

## 2021-01-04 NOTE — ED Provider Notes (Signed)
[facility]    CSN: [id number] Arrival date & time: 01/04/21  1142      History   Chief Complaint Chief Complaint  Patient presents with   Abdominal Pain    RUQ    HPI Audrey Ortega is a 45 y.o. female.   HPI  45 year old female here for evaluation of abdominal pain.  Patient reports that she developed pain in her epigastrium at about 830 last night after eating.  The pain lasted several hours and was associated with some nausea.  She states that she went to bed but then she was awoken about midnight with intense pain.  She attempted to use the bathroom but was unable to have a bowel movement.  Upon getting up from the toilet to get a heating pad she had an episode of coning of her vision, shortness of breath, and diaphoresis that resolved and has not returned.  She denies any vomiting, fever, chest pain, previous episodes of same, and states that she still has her gallbladder.  She does have a history of constipation and reports that her last bowel movement was maybe 2 days ago.  Past Medical History:  Diagnosis Date   Allergy    Anxiety    Chicken pox    Depression    Emphysema of lung (HCC)    Frequent headaches    Hypothyroidism    Migraine     Patient Active Problem List   Diagnosis Date Noted   Infected sebaceous cyst 06/09/2013   Tobacco use disorder 12/24/2012   Hypothyroid 12/29/2011   Anxiety and depression 12/29/2011    Past Surgical History:  Procedure Laterality Date   NO PAST SURGERIES      OB History   No obstetric history on file.      Home Medications    Prior to Admission medications   Medication Sig Start Date End Date Taking? Authorizing Provider  loratadine (CLARITIN) 10 MG tablet Take 10 mg by mouth daily.   Yes [provider]  ondansetron (ZOFRAN ODT) 8 MG disintegrating tablet Take 1 tablet (8 mg total) by mouth every 8 (eight) hours as needed for nausea or vomiting. 01/04/21  Yes Becky Augusta, NP   pantoprazole (PROTONIX) 20 MG tablet Take 1 tablet (20 mg total) by mouth daily. 01/04/21  Yes Becky Augusta, NP  albuterol (VENTOLIN HFA) 108 (90 Base) MCG/ACT inhaler Inhale 1-2 puffs into the lungs every 6 (six) hours as needed for wheezing or shortness of breath. 02/23/19   Tommie Sams, DO  budesonide-formoterol (SYMBICORT) 160-4.5 MCG/ACT inhaler Inhale 2 puffs into the lungs 2 (two) times daily. 02/23/19   Tommie Sams, DO    Family History Family History  Problem Relation Age of Onset   Arthritis Mother    Hypertension Mother    Diabetes Mother    Arthritis Father    Hypertension Father    Hyperlipidemia Father    Thyroid disease Father        Hypothyroidism   Diabetes Paternal Grandmother    Hypertension Paternal Grandmother    Diabetes Paternal Grandfather    Alcohol abuse Paternal Grandfather    Hypertension Paternal Grandfather    Cancer Maternal Aunt        Breast   Alcohol abuse Maternal Grandmother    Heart attack Brother    Hyperlipidemia Brother    Stroke Brother    Healthy Daughter     Social History Social History   Tobacco Use   Smoking  status: Every Day    Packs/day: 1.00    Years: 20.00    Pack years: 20.00    Types: Cigarettes   Smokeless tobacco: Never  Vaping Use   Vaping Use: Never used  Substance Use Topics   Alcohol use: No   Drug use: No     Allergies   Augmentin [amoxicillin-pot clavulanate]   Review of Systems Review of Systems  Constitutional:  Positive for diaphoresis. Negative for activity change, appetite change and fever.  Respiratory:  Positive for shortness of breath.   Gastrointestinal:  Positive for abdominal pain, constipation and nausea. Negative for diarrhea and vomiting.  Genitourinary:  Negative for dysuria, frequency and urgency.    Physical Exam Triage Vital Signs ED Triage Vitals  Enc Vitals Group     BP 01/04/21 1212 108/60     Pulse Rate 01/04/21 1212 70     Resp 01/04/21 1212 14     Temp 01/04/21 1212  98.3 F (36.8 C)     Temp Source 01/04/21 1212 Oral     SpO2 01/04/21 1212 100 %     Weight 01/04/21 1209 175 lb 0.7 oz (79.4 kg)     Height 01/04/21 1209 5\' 7"  (1.702 m)     Head Circumference --      Peak Flow --      Pain Score 01/04/21 1208 4     Pain Loc --      Pain Edu? --      Excl. in [place]? --    No data found.  Updated Vital Signs BP 108/60 (BP Location: Left Arm)   Pulse 70   Temp 98.3 F (36.8 C) (Oral)   Resp 14   Ht 5\' 7"  (1.702 m)   Wt 175 lb 0.7 oz (79.4 kg)   LMP 12/11/2020 (Approximate) Comment: hcg neg  SpO2 100%   BMI 27.42 kg/m   Visual Acuity Right Eye Distance:   Left Eye Distance:   Bilateral Distance:    Right Eye Near:   Left Eye Near:    Bilateral Near:     Physical Exam Vitals and nursing note reviewed.  Constitutional:      General: She is not in acute distress.    Appearance: She is well-developed and normal weight. She is not ill-appearing.  HENT:     Head: Normocephalic and atraumatic.  Cardiovascular:     Rate and Rhythm: Normal rate and regular rhythm.     Heart sounds: Normal heart sounds. No murmur heard.   No gallop.  Pulmonary:     Effort: Pulmonary effort is normal.     Breath sounds: Normal breath sounds. No wheezing, rhonchi or rales.  Abdominal:     General: Bowel sounds are normal.     Tenderness: There is abdominal tenderness in the epigastric area.     Hernia: No hernia is present.  Skin:    General: Skin is warm and dry.     Capillary Refill: Capillary refill takes less than 2 seconds.     Findings: No erythema or rash.  Neurological:     General: No focal deficit present.     Mental Status: She is alert and oriented to person, place, and time.  Psychiatric:        Mood and Affect: Mood normal.        Behavior: Behavior normal.     UC Treatments / Results  Labs (all labs ordered are listed, but only abnormal results are displayed) Labs Reviewed  CBC WITH DIFFERENTIAL/PLATELET - Abnormal; Notable for  the following components:      Result Value   WBC 17.4 (*)    Neutro Abs 13.5 (*)    Abs Immature Granulocytes 0.08 (*)    All other components within normal limits  COMPREHENSIVE METABOLIC PANEL - Abnormal; Notable for the following components:   AST 14 (*)    All other components within normal limits  URINALYSIS, COMPLETE (UACMP) WITH MICROSCOPIC - Abnormal; Notable for the following components:   Specific Gravity, Urine <1.005 (*)    Bacteria, UA RARE (*)    All other components within normal limits  LIPASE, BLOOD  PREGNANCY, URINE    EKG   Radiology DG Abd 2 Views  Result Date: 01/04/2021 CLINICAL DATA:  Abdominal pain and nausea EXAM: ABDOMEN - 2 VIEW COMPARISON:  None. FINDINGS: Supine and upright images obtained. Moderate stool throughout colon. No evident bowel dilatation or air-fluid levels to suggest bowel obstruction. No free air. No abnormal calcifications. Visualized lower lung regions clear. IMPRESSION: Moderate stool in colon. No demonstrable bowel obstruction or free air. Visualized lung regions clear. Electronically Signed   By: Bretta BangWilliam  Woodruff III M.D.   On: 01/04/2021 14:11    Procedures Procedures (including critical care time)  Medications Ordered in UC Medications - No data to display  Initial Impression / Assessment and Plan / UC Course  I have reviewed the triage vital signs and the nursing notes.  Pertinent labs & imaging results that were available during my care of the patient were reviewed by me and considered in my medical decision making (see chart for details).  Patient is a very pleasant [age] old female here for evaluation of abdominal pain as outlined in HPI above.  Patient physical exam reveals a benign cardiopulmonary exam.  Abdomen is soft, flat, with epigastric tenderness to palpation.  Patient has no guarding or rebound.  Unsure of etiology of patient's pain but it there is a concern for gallbladder disease as patient still has her gallbladder  and the pain began after eating.  The other concern is possible peptic ulcer disease but that generally improves with food and does not get worse.  Will check CBC, CMP, lipase, UA, and two-view of the abdomen as patient also has a history of constipation.  CBC shows an elevated white count of 17.4.  H&H is 12.7 and 37.  Platelets 308.  CMP is unremarkable.  Urinalysis shows rare bacteria but otherwise unremarkable.  Lipase is 29.  Urine pregnancy test is negative.  Two-view abdomen independently reviewed and evaluated by me.  Interpretation: There appears to be a moderate stool burden in the ascending colon and 2 parts of the transverse.  No other abdomen allergies readily identified on exam.  Awaiting radiology overread.  Radiology evaluation states some moderate stool burden in the colon without evidence of obstruction.  Patient is although a blood cell count may be secondary to her constipation as this inflammatory process.  I am reassured by the fact that she does not have a bump in her LFTs or lipase that this is not cholecystitis.  We will place patient on Protonix daily, treat her for constipation, and have her follow-up with her primary care provider.  ER precautions reviewed with patient.   Final Clinical Impressions(s) / UC Diagnoses   Final diagnoses:  Abdominal pain  Right upper quadrant abdominal pain  Abdominal pain, epigastric     Discharge Instructions      Your blood work today  did not show any elevations to your liver enzymes or to your lipase which is reassuring that this may not be your gallbladder.  Take the Protonix once daily to help with your epigastric pain.  Use the Zofran every 8 hours as needed for nausea.  Your x-ray did show a moderate stool burden in your colon so some your pain may be a result of constipation.  Take over-the-counter MiraLAX, 1 capful in 8 ounces of a beverage of your choice, daily to help resolve constipation and achieve normal  bowel movements.  If you have an increase in your abdominal pain, develop a fever, you start vomiting blood, passing blood in her stool you need go to the ER for evaluation.     ED Prescriptions     Medication Sig Dispense Auth. Provider   pantoprazole (PROTONIX) 20 MG tablet Take 1 tablet (20 mg total) by mouth daily. 30 tablet Becky Augusta, NP   ondansetron (ZOFRAN ODT) 8 MG disintegrating tablet Take 1 tablet (8 mg total) by mouth every 8 (eight) hours as needed for nausea or vomiting. 20 tablet Becky Augusta, NP      PDMP not reviewed this encounter.   Becky Augusta, NP 01/04/21 1438

## 2021-01-04 NOTE — Discharge Instructions (Addendum)
Your blood work today did not show any elevations to your liver enzymes or to your lipase which is reassuring that this may not be your gallbladder.  Take the Protonix once daily to help with your epigastric pain.  Use the Zofran every 8 hours as needed for nausea.  Your x-ray did show a moderate stool burden in your colon so some your pain may be a result of constipation.  Take over-the-counter MiraLAX, 1 capful in 8 ounces of a beverage of your choice, daily to help resolve constipation and achieve normal bowel movements.  If you have an increase in your abdominal pain, develop a fever, you start vomiting blood, passing blood in her stool you need go to the ER for evaluation.

## 2021-01-06 DIAGNOSIS — K2100 Gastro-esophageal reflux disease with esophagitis, without bleeding: Secondary | ICD-10-CM | POA: Diagnosis not present

## 2021-01-06 DIAGNOSIS — K219 Gastro-esophageal reflux disease without esophagitis: Secondary | ICD-10-CM | POA: Diagnosis not present

## 2021-02-28 DIAGNOSIS — E559 Vitamin D deficiency, unspecified: Secondary | ICD-10-CM | POA: Diagnosis not present

## 2021-02-28 DIAGNOSIS — R7989 Other specified abnormal findings of blood chemistry: Secondary | ICD-10-CM | POA: Diagnosis not present

## 2021-02-28 DIAGNOSIS — N926 Irregular menstruation, unspecified: Secondary | ICD-10-CM | POA: Diagnosis not present

## 2021-02-28 DIAGNOSIS — N921 Excessive and frequent menstruation with irregular cycle: Secondary | ICD-10-CM | POA: Diagnosis not present

## 2021-02-28 DIAGNOSIS — R300 Dysuria: Secondary | ICD-10-CM | POA: Diagnosis not present

## 2021-03-21 DIAGNOSIS — Z01419 Encounter for gynecological examination (general) (routine) without abnormal findings: Secondary | ICD-10-CM | POA: Diagnosis not present

## 2021-03-21 DIAGNOSIS — Z124 Encounter for screening for malignant neoplasm of cervix: Secondary | ICD-10-CM | POA: Diagnosis not present

## 2021-03-21 DIAGNOSIS — N926 Irregular menstruation, unspecified: Secondary | ICD-10-CM | POA: Diagnosis not present

## 2021-03-21 DIAGNOSIS — Z1231 Encounter for screening mammogram for malignant neoplasm of breast: Secondary | ICD-10-CM | POA: Diagnosis not present

## 2021-03-21 DIAGNOSIS — N921 Excessive and frequent menstruation with irregular cycle: Secondary | ICD-10-CM | POA: Diagnosis not present

## 2021-03-27 ENCOUNTER — Other Ambulatory Visit: Payer: Self-pay

## 2021-03-27 DIAGNOSIS — Z1231 Encounter for screening mammogram for malignant neoplasm of breast: Secondary | ICD-10-CM

## 2021-04-09 ENCOUNTER — Ambulatory Visit
Admission: RE | Admit: 2021-04-09 | Discharge: 2021-04-09 | Disposition: A | Discharge status: Home / Self Care | Payer: 59 | Source: Ambulatory Visit

## 2021-04-09 ENCOUNTER — Other Ambulatory Visit: Payer: Self-pay

## 2021-04-09 DIAGNOSIS — Z1231 Encounter for screening mammogram for malignant neoplasm of breast: Secondary | ICD-10-CM | POA: Diagnosis not present

## 2021-04-16 ENCOUNTER — Other Ambulatory Visit: Payer: Self-pay

## 2021-04-16 ENCOUNTER — Telehealth: Payer: Self-pay

## 2021-04-16 DIAGNOSIS — N6489 Other specified disorders of breast: Secondary | ICD-10-CM

## 2021-04-16 DIAGNOSIS — R928 Other abnormal and inconclusive findings on diagnostic imaging of breast: Secondary | ICD-10-CM

## 2021-04-16 NOTE — Telephone Encounter (Signed)
Patient is unsure if she can come in for her appointment tomorrow due to being around her husband who tested positive for COVID yesterday.She has tested negative and is not having any symptoms as well as being cleared by [employer] to return to work.Please advise.

## 2021-04-16 NOTE — Telephone Encounter (Signed)
Patient is going to keep appt and do virtual.

## 2021-04-17 ENCOUNTER — Encounter: Payer: Self-pay | Admitting: Internal Medicine

## 2021-04-17 ENCOUNTER — Telehealth: Payer: 59 | Admitting: Internal Medicine

## 2021-04-17 DIAGNOSIS — R928 Other abnormal and inconclusive findings on diagnostic imaging of breast: Secondary | ICD-10-CM | POA: Diagnosis not present

## 2021-04-17 DIAGNOSIS — Z20822 Contact with and (suspected) exposure to covid-19: Secondary | ICD-10-CM | POA: Diagnosis not present

## 2021-04-17 DIAGNOSIS — E039 Hypothyroidism, unspecified: Secondary | ICD-10-CM

## 2021-04-17 DIAGNOSIS — F17200 Nicotine dependence, unspecified, uncomplicated: Secondary | ICD-10-CM | POA: Diagnosis not present

## 2021-04-17 DIAGNOSIS — R1013 Epigastric pain: Secondary | ICD-10-CM | POA: Diagnosis not present

## 2021-04-17 DIAGNOSIS — R5383 Other fatigue: Secondary | ICD-10-CM | POA: Diagnosis not present

## 2021-04-17 DIAGNOSIS — E559 Vitamin D deficiency, unspecified: Secondary | ICD-10-CM

## 2021-04-17 DIAGNOSIS — Z124 Encounter for screening for malignant neoplasm of cervix: Secondary | ICD-10-CM

## 2021-04-17 NOTE — Progress Notes (Signed)
Patient ID: Audrey Ortega, female   DOB: Jul 14, 1976, 45 y.o.   MRN: [id number]   Virtual Visit via video Note  This visit type was conducted due to national recommendations for restrictions regarding the COVID-19 pandemic (e.g. social distancing).  This format is felt to be most appropriate for this patient at this time.  All issues noted in this document were discussed and addressed.  No physical exam was performed (except for noted visual exam findings with Video Visits).   I connected with Karl Ito by a video enabled telemedicine application and verified that I am speaking with the correct person using two identifiers. Location patient: home Location provider: work  Persons participating in the virtual visit: patient, provider  The limitations, risks, security and privacy concerns of performing an evaluation and management service by video and the availability of in person appointments have been discussed.  It has also been discussed with the patient that there may be a patient responsible charge related to this service. The patient expressed understanding and agreed to proceed.   Reason for visit: establish care.   HPI: Has a history of thyroid disease.  Past surgical history - D&C.  Reports periods irregular.  Period 2x/month from May to August/2022.  September - 1x/month.  S/p D&C miscarriage 2000 and D&C 2011 - due to bleeding.  Saw Dr Alanda Slim 03/21/21.  PAP performed.  Mammogram ordered.  Ultrasound - reassuring.  Maternal aunt - Breast cancer.  Found to have decreased vitamin D. On supplements.  She is a smoker.  States has been told has minimal COPD.  Has rescue inhaler prn.  Uses symbicort.  No chest pain.  Breathing overall stable.  No acid reflux.  No abdominal pain now.  Bowels moving.  Was evaluated 12/2020 - epigastric pain and nausea. Felt dizzy and sweaty.  Diagnosed with possible ulcer disease.  Placed on protonix.  Better now.  No other episodes.  Eating.  Husband diagnosed with  covid Monday of this week.  She has tested twice - negative.  No symptoms.     ROS: See pertinent positives and negatives per HPI.  Past Medical History:  Diagnosis Date   Allergy    Anxiety    Chicken pox    Depression    Emphysema of lung (HCC)    Frequent headaches    Hypothyroidism    Migraine     Past Surgical History:  Procedure Laterality Date   NO PAST SURGERIES      Family History  Problem Relation Age of Onset   Arthritis Mother    Hypertension Mother    Diabetes Mother    Arthritis Father    Hypertension Father    Hyperlipidemia Father    Thyroid disease Father        Hypothyroidism   Healthy Daughter    Breast cancer Maternal Aunt 41   Cancer Maternal Aunt        Breast   Alcohol abuse Maternal Grandmother    Diabetes Paternal Grandmother    Hypertension Paternal Grandmother    Diabetes Paternal Grandfather    Alcohol abuse Paternal Grandfather    Hypertension Paternal Grandfather    Heart attack Brother    Hyperlipidemia Brother    Stroke Brother     SOCIAL HX: reviewed.    Current Outpatient Medications:    albuterol (VENTOLIN HFA) 108 (90 Base) MCG/ACT inhaler, Inhale 1-2 puffs into the lungs every 6 (six) hours as needed for wheezing or shortness of breath., Disp:  18 g, Rfl: 6   budesonide-formoterol (SYMBICORT) 160-4.5 MCG/ACT inhaler, Inhale 2 puffs into the lungs 2 (two) times daily., Disp: 1 Inhaler, Rfl: 12   loratadine (CLARITIN) 10 MG tablet, Take 10 mg by mouth daily., Disp: , Rfl:    ondansetron (ZOFRAN ODT) 8 MG disintegrating tablet, Take 1 tablet (8 mg total) by mouth every 8 (eight) hours as needed for nausea or vomiting., Disp: 20 tablet, Rfl: 0   Vitamin D, Ergocalciferol, (DRISDOL) 1.25 MG (50000 UNIT) CAPS capsule, Take 50,000 Units by mouth once a week., Disp: , Rfl:   EXAM:  GENERAL: alert, oriented, appears well and in no acute distress  HEENT: atraumatic, conjunttiva clear, no obvious abnormalities on inspection of  external nose and ears  NECK: normal movements of the head and neck  LUNGS: on inspection no signs of respiratory distress, breathing rate appears normal, no obvious gross SOB, gasping or wheezing  CV: no obvious cyanosis  PSYCH/NEURO: pleasant and cooperative, no obvious depression or anxiety, speech and thought processing grossly intact  ASSESSMENT AND PLAN:  Discussed the following assessment and plan:  Problem List Items Addressed This Visit     Abnormal mammogram    Abnormal mammogram 04/09/21.  Scheduled for f/u views.       Cervical cancer screening    Saw Dr Dalbert Garnet.  PAP performed.        Close exposure to COVID-19 virus    Husband diagnosed with covid.  She has tested 2x - negative.  No symptoms.  Follow. Notify me if develops symtpoms.        Epigastric pain    Had the episode of epigastric pain in 12/2020 as outlined.  On protonix.  No pain since.  Follow.        Fatigue    Reports fatigue.  Review labs.  Vitamin  D def - replace.  Check routine labs.        Hypothyroid    Has a history of thyroid disease per report.  Has seen endocrinology.        Tobacco use disorder    Smokes.  Has minimal COPD.  Uses symbicort.  Has rescue inhaler if needed.  Discussed.  Needs to quit.  Follow.        Vitamin D deficiency    Vitamin d deficiency.  On vitamin D supplements.  Follow.        Return in about 2 months (around 06/17/2021) for follow up appt ( ).   I discussed the assessment and treatment plan with the patient. The patient was provided an opportunity to ask questions and all were answered. The patient agreed with the plan and demonstrated an understanding of the instructions.   The patient was advised to call back or seek an in-person evaluation if the symptoms worsen or if the condition fails to improve as anticipated.    Dale [place], MD

## 2021-04-21 ENCOUNTER — Encounter: Payer: Self-pay | Admitting: Internal Medicine

## 2021-04-21 DIAGNOSIS — R928 Other abnormal and inconclusive findings on diagnostic imaging of breast: Secondary | ICD-10-CM | POA: Insufficient documentation

## 2021-04-21 DIAGNOSIS — Z20822 Contact with and (suspected) exposure to covid-19: Secondary | ICD-10-CM | POA: Insufficient documentation

## 2021-04-21 DIAGNOSIS — R1013 Epigastric pain: Secondary | ICD-10-CM | POA: Insufficient documentation

## 2021-04-21 DIAGNOSIS — Z124 Encounter for screening for malignant neoplasm of cervix: Secondary | ICD-10-CM | POA: Insufficient documentation

## 2021-04-21 DIAGNOSIS — R5383 Other fatigue: Secondary | ICD-10-CM | POA: Insufficient documentation

## 2021-04-21 DIAGNOSIS — E559 Vitamin D deficiency, unspecified: Secondary | ICD-10-CM | POA: Insufficient documentation

## 2021-04-21 NOTE — Assessment & Plan Note (Signed)
Husband diagnosed with covid.  She has tested 2x - negative.  No symptoms.  Follow. Notify me if develops symtpoms.

## 2021-04-21 NOTE — Assessment & Plan Note (Signed)
Smokes.  Has minimal COPD.  Uses symbicort.  Has rescue inhaler if needed.  Discussed.  Needs to quit.  Follow.

## 2021-04-21 NOTE — Assessment & Plan Note (Signed)
Reports fatigue.  Review labs.  Vitamin  D def - replace.  Check routine labs.

## 2021-04-21 NOTE — Assessment & Plan Note (Signed)
Abnormal mammogram 04/09/21.  Scheduled for f/u views.

## 2021-04-21 NOTE — Assessment & Plan Note (Signed)
Had the episode of epigastric pain in 12/2020 as outlined.  On protonix.  No pain since.  Follow.

## 2021-04-21 NOTE — Assessment & Plan Note (Signed)
Vitamin d deficiency.  On vitamin D supplements.  Follow.

## 2021-04-21 NOTE — Assessment & Plan Note (Signed)
Has a history of thyroid disease per report.  Has seen endocrinology.

## 2021-04-21 NOTE — Assessment & Plan Note (Signed)
Saw Dr Dalbert Garnet.  PAP performed.

## 2021-04-22 ENCOUNTER — Ambulatory Visit
Admission: RE | Admit: 2021-04-22 | Discharge: 2021-04-22 | Disposition: A | Discharge status: Home / Self Care | Payer: 59 | Source: Ambulatory Visit

## 2021-04-22 ENCOUNTER — Other Ambulatory Visit: Payer: Self-pay

## 2021-04-22 DIAGNOSIS — R928 Other abnormal and inconclusive findings on diagnostic imaging of breast: Secondary | ICD-10-CM

## 2021-04-22 DIAGNOSIS — R922 Inconclusive mammogram: Secondary | ICD-10-CM | POA: Diagnosis not present

## 2021-04-22 DIAGNOSIS — N6489 Other specified disorders of breast: Secondary | ICD-10-CM | POA: Insufficient documentation

## 2021-05-30 DIAGNOSIS — E559 Vitamin D deficiency, unspecified: Secondary | ICD-10-CM | POA: Diagnosis not present

## 2021-05-30 DIAGNOSIS — E039 Hypothyroidism, unspecified: Secondary | ICD-10-CM | POA: Diagnosis not present

## 2021-08-07 DIAGNOSIS — M542 Cervicalgia: Secondary | ICD-10-CM | POA: Diagnosis not present

## 2021-08-07 DIAGNOSIS — M9902 Segmental and somatic dysfunction of thoracic region: Secondary | ICD-10-CM | POA: Diagnosis not present

## 2021-08-07 DIAGNOSIS — M546 Pain in thoracic spine: Secondary | ICD-10-CM | POA: Diagnosis not present

## 2021-08-07 DIAGNOSIS — M9901 Segmental and somatic dysfunction of cervical region: Secondary | ICD-10-CM | POA: Diagnosis not present

## 2021-08-08 ENCOUNTER — Other Ambulatory Visit: Payer: Self-pay

## 2021-08-08 DIAGNOSIS — M9901 Segmental and somatic dysfunction of cervical region: Secondary | ICD-10-CM | POA: Diagnosis not present

## 2021-08-08 DIAGNOSIS — M542 Cervicalgia: Secondary | ICD-10-CM | POA: Diagnosis not present

## 2021-08-08 DIAGNOSIS — M9902 Segmental and somatic dysfunction of thoracic region: Secondary | ICD-10-CM | POA: Diagnosis not present

## 2021-08-08 DIAGNOSIS — N6320 Unspecified lump in the left breast, unspecified quadrant: Secondary | ICD-10-CM

## 2021-08-08 DIAGNOSIS — M546 Pain in thoracic spine: Secondary | ICD-10-CM | POA: Diagnosis not present

## 2021-08-15 DIAGNOSIS — M9901 Segmental and somatic dysfunction of cervical region: Secondary | ICD-10-CM | POA: Diagnosis not present

## 2021-08-15 DIAGNOSIS — M9902 Segmental and somatic dysfunction of thoracic region: Secondary | ICD-10-CM | POA: Diagnosis not present

## 2021-08-15 DIAGNOSIS — M546 Pain in thoracic spine: Secondary | ICD-10-CM | POA: Diagnosis not present

## 2021-08-15 DIAGNOSIS — M542 Cervicalgia: Secondary | ICD-10-CM | POA: Diagnosis not present

## 2021-08-19 DIAGNOSIS — M9901 Segmental and somatic dysfunction of cervical region: Secondary | ICD-10-CM | POA: Diagnosis not present

## 2021-08-19 DIAGNOSIS — M9902 Segmental and somatic dysfunction of thoracic region: Secondary | ICD-10-CM | POA: Diagnosis not present

## 2021-08-19 DIAGNOSIS — M542 Cervicalgia: Secondary | ICD-10-CM | POA: Diagnosis not present

## 2021-08-19 DIAGNOSIS — M546 Pain in thoracic spine: Secondary | ICD-10-CM | POA: Diagnosis not present

## 2021-08-22 DIAGNOSIS — M546 Pain in thoracic spine: Secondary | ICD-10-CM | POA: Diagnosis not present

## 2021-08-22 DIAGNOSIS — M542 Cervicalgia: Secondary | ICD-10-CM | POA: Diagnosis not present

## 2021-08-22 DIAGNOSIS — M9902 Segmental and somatic dysfunction of thoracic region: Secondary | ICD-10-CM | POA: Diagnosis not present

## 2021-08-22 DIAGNOSIS — M9901 Segmental and somatic dysfunction of cervical region: Secondary | ICD-10-CM | POA: Diagnosis not present

## 2021-08-28 DIAGNOSIS — M9901 Segmental and somatic dysfunction of cervical region: Secondary | ICD-10-CM | POA: Diagnosis not present

## 2021-08-28 DIAGNOSIS — M546 Pain in thoracic spine: Secondary | ICD-10-CM | POA: Diagnosis not present

## 2021-08-28 DIAGNOSIS — M9902 Segmental and somatic dysfunction of thoracic region: Secondary | ICD-10-CM | POA: Diagnosis not present

## 2021-08-28 DIAGNOSIS — M542 Cervicalgia: Secondary | ICD-10-CM | POA: Diagnosis not present

## 2021-10-07 DIAGNOSIS — R300 Dysuria: Secondary | ICD-10-CM | POA: Diagnosis not present

## 2021-10-22 ENCOUNTER — Ambulatory Visit (INDEPENDENT_AMBULATORY_CARE_PROVIDER_SITE_OTHER): Payer: 59

## 2021-10-22 ENCOUNTER — Ambulatory Visit: Payer: 59 | Admitting: Internal Medicine

## 2021-10-22 VITALS — BP 114/70 | HR 80 | Temp 97.9°F | Resp 16 | Ht 66.0 in | Wt 143.4 lb

## 2021-10-22 DIAGNOSIS — Z1231 Encounter for screening mammogram for malignant neoplasm of breast: Secondary | ICD-10-CM

## 2021-10-22 DIAGNOSIS — E785 Hyperlipidemia, unspecified: Secondary | ICD-10-CM | POA: Diagnosis not present

## 2021-10-22 DIAGNOSIS — R928 Other abnormal and inconclusive findings on diagnostic imaging of breast: Secondary | ICD-10-CM

## 2021-10-22 DIAGNOSIS — Z124 Encounter for screening for malignant neoplasm of cervix: Secondary | ICD-10-CM | POA: Diagnosis not present

## 2021-10-22 DIAGNOSIS — M542 Cervicalgia: Secondary | ICD-10-CM | POA: Insufficient documentation

## 2021-10-22 DIAGNOSIS — R42 Dizziness and giddiness: Secondary | ICD-10-CM

## 2021-10-22 DIAGNOSIS — E039 Hypothyroidism, unspecified: Secondary | ICD-10-CM | POA: Diagnosis not present

## 2021-10-22 DIAGNOSIS — F419 Anxiety disorder, unspecified: Secondary | ICD-10-CM | POA: Diagnosis not present

## 2021-10-22 DIAGNOSIS — R5383 Other fatigue: Secondary | ICD-10-CM

## 2021-10-22 DIAGNOSIS — R519 Headache, unspecified: Secondary | ICD-10-CM

## 2021-10-22 DIAGNOSIS — Z1211 Encounter for screening for malignant neoplasm of colon: Secondary | ICD-10-CM

## 2021-10-22 DIAGNOSIS — F32A Depression, unspecified: Secondary | ICD-10-CM

## 2021-10-22 LAB — HEPATIC FUNCTION PANEL
ALT: 8 U/L (ref 0–35)
AST: 13 U/L (ref 0–37)
Albumin: 4.1 g/dL (ref 3.5–5.2)
Alkaline Phosphatase: 43 U/L (ref 39–117)
Bilirubin, Direct: 0.1 mg/dL (ref 0.0–0.3)
Total Bilirubin: 0.6 mg/dL (ref 0.2–1.2)
Total Protein: 6.6 g/dL (ref 6.0–8.3)

## 2021-10-22 LAB — CBC WITH DIFFERENTIAL/PLATELET
Basophils Absolute: 0.2 10*3/uL — ABNORMAL HIGH (ref 0.0–0.1)
Basophils Relative: 2 % (ref 0.0–3.0)
Eosinophils Absolute: 0.3 10*3/uL (ref 0.0–0.7)
Eosinophils Relative: 4.3 % (ref 0.0–5.0)
HCT: 39.1 % (ref 36.0–46.0)
Hemoglobin: 13.1 g/dL (ref 12.0–15.0)
Lymphocytes Relative: 20.2 % (ref 12.0–46.0)
Lymphs Abs: 1.6 10*3/uL (ref 0.7–4.0)
MCHC: 33.4 g/dL (ref 30.0–36.0)
MCV: 92.9 fl (ref 78.0–100.0)
Monocytes Absolute: 0.5 10*3/uL (ref 0.1–1.0)
Monocytes Relative: 6.8 % (ref 3.0–12.0)
Neutro Abs: 5.4 10*3/uL (ref 1.4–7.7)
Neutrophils Relative %: 66.7 % (ref 43.0–77.0)
Platelets: 302 10*3/uL (ref 150.0–400.0)
RBC: 4.21 Mil/uL (ref 3.87–5.11)
RDW: 13.6 % (ref 11.5–15.5)
WBC: 8 10*3/uL (ref 4.0–10.5)

## 2021-10-22 LAB — BASIC METABOLIC PANEL
BUN: 6 mg/dL (ref 6–23)
CO2: 29 mEq/L (ref 19–32)
Calcium: 9 mg/dL (ref 8.4–10.5)
Chloride: 104 mEq/L (ref 96–112)
Creatinine, Ser: 0.74 mg/dL (ref 0.40–1.20)
GFR: 96.75 mL/min (ref >60.00)
Glucose, Bld: 82 mg/dL (ref 70–99)
Potassium: 4.3 mEq/L (ref 3.5–5.1)
Sodium: 137 mEq/L (ref 135–145)

## 2021-10-22 LAB — LIPID PANEL
Cholesterol: 155 mg/dL (ref 0–200)
HDL: 46.7 mg/dL (ref >39.00)
LDL Cholesterol: 98 mg/dL (ref 0–99)
NonHDL: 108.53
Total CHOL/HDL Ratio: 3
Triglycerides: 52 mg/dL (ref 0.0–149.0)
VLDL: 10.4 mg/dL (ref 0.0–40.0)

## 2021-10-22 LAB — TSH: TSH: 3.87 u[IU]/mL (ref 0.35–5.50)

## 2021-10-22 MED ORDER — MAGNESIUM OXIDE 400 MG PO CAPS: ORAL_CAPSULE | ORAL | 2 refills | Status: DC

## 2021-10-22 NOTE — Progress Notes (Signed)
Patient ID: Audrey Ortega, female   DOB: 10/13/1975, 46 y.o.   MRN: [id number] ? ? ?Subjective:  ? ? Patient ID: Audrey Ortega, female    DOB: 12/05/1975, 46 y.o.   MRN: [id number] ? ?This visit occurred during the SARS-CoV-2 public health emergency.  Safety protocols were in place, including screening questions prior to the visit, additional usage of staff PPE, and extensive cleaning of exam room while observing appropriate contact time as indicated for disinfecting solutions.  ? ?Patient here for a scheduled follow up.  ? ?Chief Complaint  ?Patient presents with  ? Follow-up  ?  6 mo follow up - pt reports feeling very stressed out lately. Working 12-16hrs per day. Getting dizzy spells, and headaches, neck pain. Pt denies syncope.   ? .  ? ?HPI ?Saw as a new pt 04/17/21. Sees gyn for breast, pelvic and pap smears.  Up to date - evaluated 03/2021.  Reports increased stress.  Work stress.  Family stress.  Discussed.  Desires not to take medication at this time.  Discussed counseling.  Working long hours.  Reports having issues with dizziness.  Notices 1 and sometimes 2x/week.  Notices with changing positions (when stands up fast, bending over and raising up).  Also reports constant headache.  Has a history of migraine headaches.  This does not feel like migraine.  Takes occasional tylenol.  Is sleeping.  Increased neck pain.  Has been seeing a [facility].  Helping, but still with some pain with rotation of her head.  Some limited rom.  No chest pain or sob reported.  No abdominal pain or bowel change reported.   ? ? ?Past Medical History:  ?Diagnosis Date  ? Allergy   ? Anxiety   ? Chicken pox   ? Depression   ? Emphysema of lung (HCC)   ? Frequent headaches   ? Hypothyroidism   ? Migraine   ? ?Past Surgical History:  ?Procedure Laterality Date  ? NO PAST SURGERIES    ? ?Family History  ?Problem Relation Age of Onset  ? Arthritis Mother   ? Hypertension Mother   ? Diabetes Mother   ? Arthritis Father   ? Hypertension  Father   ? Hyperlipidemia Father   ? Thyroid disease Father   ?     Hypothyroidism  ? Healthy Daughter   ? Breast cancer Maternal Aunt 50  ? Cancer Maternal Aunt   ?     Breast  ? Alcohol abuse Maternal Grandmother   ? Diabetes Paternal Grandmother   ? Hypertension Paternal Grandmother   ? Diabetes Paternal Grandfather   ? Alcohol abuse Paternal Grandfather   ? Hypertension Paternal Grandfather   ? Heart attack Brother   ? Hyperlipidemia Brother   ? Stroke Brother   ? ?Social History  ? ?Socioeconomic History  ? Marital status: Married  ?  Spouse name: Not on file  ? Number of children: 1  ? Years of education: Not on file  ? Highest education level: Not on file  ?Occupational History  ? 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  Employer:   ?Tobacco Use  ? Smoking status: Every Day  ?  Packs/day: 1.00  ?  Years: 20.00  ?  Pack years: 20.00  ?  Types: Cigarettes  ? Smokeless tobacco: Never  ?Vaping Use  ? Vaping Use: Never used  ?Substance and Sexual Activity  ? Alcohol use: Yes  ?  Comment: Rare use  ? Drug use: No  ?  Sexual activity: Yes  ?  Partners: Male  ?Other Topics Concern  ? Not on file  ?Social History Narrative  ? Not on file  ? ?Social Determinants of Health  ? ?Financial Resource Strain: Not on file  ?Food Insecurity: Not on file  ?Transportation Needs: Not on file  ?Physical Activity: Not on file  ?Stress: Not on file  ?Social Connections: Not on file  ? ? ? ?Review of Systems  ?Constitutional:  Negative for appetite change and unexpected weight change.  ?HENT:  Negative for congestion and sinus pressure.   ?Respiratory:  Negative for cough, chest tightness and shortness of breath.   ?Cardiovascular:  Negative for chest pain, palpitations and leg swelling.  ?Gastrointestinal:  Negative for abdominal pain, diarrhea, nausea and vomiting.  ?Genitourinary:  Negative for difficulty urinating and dysuria.  ?Musculoskeletal:  Negative for joint swelling and myalgias.  ?Skin:  Negative for color  change and rash.  ?Neurological:  Positive for dizziness and headaches.  ?Psychiatric/Behavioral:  Negative for agitation.   ?     Increased stress as outlined.    ? ?   ?Objective:  ?  ? ?BP 114/70   Pulse 80   Temp 97.9 ?F (36.6 ?C)   Resp 16   Ht 5\' 6"  (1.676 m)   Wt 143 lb 6.4 oz (65 kg)   SpO2 99%   BMI 23.15 kg/m?  ?Wt Readings from Last 3 Encounters:  ?10/22/21 143 lb 6.4 oz (65 kg)  ?04/17/21 140 lb (63.5 kg)  ?01/04/21 175 lb 0.7 oz (79.4 kg)  ? ? ?Physical Exam ?Vitals reviewed.  ?Constitutional:   ?   General: She is not in acute distress. ?   Appearance: Normal appearance.  ?HENT:  ?   Head: Normocephalic and atraumatic.  ?   Right Ear: External ear normal.  ?   Left Ear: External ear normal.  ?Eyes:  ?   General: No scleral icterus.    ?   Right eye: No discharge.     ?   Left eye: No discharge.  ?   Conjunctiva/sclera: Conjunctivae normal.  ?Neck:  ?   Thyroid: No thyromegaly.  ?   Comments: Some increased discomfort with looking from right to left.  ?Cardiovascular:  ?   Rate and Rhythm: Normal rate and regular rhythm.  ?Pulmonary:  ?   Effort: No respiratory distress.  ?   Breath sounds: Normal breath sounds. No wheezing.  ?Abdominal:  ?   General: Bowel sounds are normal.  ?   Palpations: Abdomen is soft.  ?   Tenderness: There is no abdominal tenderness.  ?Musculoskeletal:     ?   General: No swelling or tenderness.  ?   Cervical back: Neck supple. No tenderness.  ?Lymphadenopathy:  ?   Cervical: No cervical adenopathy.  ?Skin: ?   Findings: No erythema or rash.  ?Neurological:  ?   Mental Status: She is alert.  ?Psychiatric:     ?   Mood and Affect: Mood normal.     ?   Behavior: Behavior normal.  ? ? ? ?Outpatient Encounter Medications as of 10/22/2021  ?Medication Sig  ? albuterol (VENTOLIN HFA) 108 (90 Base) MCG/ACT inhaler Inhale 1-2 puffs into the lungs every 6 (six) hours as needed for wheezing or shortness of breath.  ? levothyroxine (SYNTHROID) 75 MCG tablet Take 75 mcg by mouth daily  before breakfast.  ? loratadine (CLARITIN) 10 MG tablet Take 10 mg by mouth daily.  ? Magnesium  Oxide 400 MG CAPS Take one capsule per day  ? ondansetron (ZOFRAN ODT) 8 MG disintegrating tablet Take 1 tablet (8 mg total) by mouth every 8 (eight) hours as needed for nausea or vomiting.  ? [DISCONTINUED] budesonide-formoterol (SYMBICORT) 160-4.5 MCG/ACT inhaler Inhale 2 puffs into the lungs 2 (two) times daily. (Patient not taking: Reported on 10/21/2021)  ? [DISCONTINUED] Vitamin D, Ergocalciferol, (DRISDOL) 1.25 MG (50000 UNIT) CAPS capsule Take 50,000 Units by mouth once a week. (Patient not taking: Reported on 10/21/2021)  ? ?No facility-administered encounter medications on file as of 10/22/2021.  ?  ? ?Lab Results  ?Component Value Date  ? WBC 8.0 10/22/2021  ? HGB 13.1 10/22/2021  ? HCT 39.1 10/22/2021  ? PLT 302.0 10/22/2021  ? GLUCOSE 82 10/22/2021  ? CHOL 155 10/22/2021  ? TRIG 52.0 10/22/2021  ? HDL 46.70 10/22/2021  ? LDLCALC 98 10/22/2021  ? ALT 8 10/22/2021  ? AST 13 10/22/2021  ? NA 137 10/22/2021  ? K 4.3 10/22/2021  ? CL 104 10/22/2021  ? CREATININE 0.74 10/22/2021  ? BUN 6 10/22/2021  ? CO2 29 10/22/2021  ? TSH 3.87 10/22/2021  ? ? ?US BREAST LTD UNI LEFT INC AXILLA ? ?Result Date: 04/22/2021 ?CLINICAL DATA:  Patient was recalled from screening mammogram for possible asymmetries in the left breast. EXAM: DIGITAL DIAGNOSTIC UNILATERAL LEFT MAMMOGRAM WITH TOMOSYNTHESIS AND CAD TECHNIQUE: Left digital diagnostic mammography and breast tomosynthesis was performed. The images were evaluated with computer-aided detection. COMPARISON:  Baseline screening mammogram dated 04/09/2021. ACR Breast Density Category c: The breast tissue is heterogeneously dense, which may obscure small masses. FINDINGS: Additional imaging of the left breast was performed. There is persistence of a 6 mm mass far posteriorly in the lateral aspect of the breast. There are no malignant type microcalcifications. On physical exam, I do not  palpate a mass in the lateral aspect of the left breast. Targeted ultrasound is performed, showing a hypoechoic mass in the left breast at 3 o'clock 5 cm from the nipple measuring 7 x 2 x 9 mm. This likely cor

## 2021-10-23 ENCOUNTER — Other Ambulatory Visit: Payer: Self-pay

## 2021-10-23 ENCOUNTER — Telehealth: Payer: Self-pay

## 2021-10-23 ENCOUNTER — Encounter: Payer: Self-pay | Admitting: Internal Medicine

## 2021-10-23 DIAGNOSIS — R42 Dizziness and giddiness: Secondary | ICD-10-CM | POA: Insufficient documentation

## 2021-10-23 DIAGNOSIS — R519 Headache, unspecified: Secondary | ICD-10-CM | POA: Insufficient documentation

## 2021-10-23 DIAGNOSIS — M542 Cervicalgia: Secondary | ICD-10-CM

## 2021-10-23 MED ORDER — TIZANIDINE HCL 4 MG PO TABS: 4.0000 mg | ORAL_TABLET | Freq: Every evening | ORAL | 0 refills | Status: DC | PRN

## 2021-10-23 NOTE — Assessment & Plan Note (Signed)
Seeing Dr Beasley.  PAP 03/2021.  

## 2021-10-23 NOTE — Telephone Encounter (Signed)
Pt advised.

## 2021-10-23 NOTE — Addendum Note (Signed)
Addended by: Charm Barges on: 10/23/2021 02:45 PM ? ? Modules accepted: Orders ? ?

## 2021-10-23 NOTE — Assessment & Plan Note (Signed)
Persistent neck pain as outlined.  Some limited rom.  Is better since seeing chiropractor.  Given persistence, check c-spine xray.  ?

## 2021-10-23 NOTE — Telephone Encounter (Signed)
Lm for pt to cb re: results 

## 2021-10-23 NOTE — Assessment & Plan Note (Signed)
Unclear etiology.  Appears to occur with certain position changes.  No room spinning.  Does have neck issues.  Could be contributing.  Check routine labs.  Stay hydrated.  Follow.  ?

## 2021-10-23 NOTE — Telephone Encounter (Signed)
Patient returned office phone call she would like a referral to PT. Patient is also asking if Dr Lorin Picket can prescribe a muscle relaxer.  ?

## 2021-10-23 NOTE — Assessment & Plan Note (Signed)
Seeing endocrinology.  On synthroid.  Check tsh.  ?

## 2021-10-23 NOTE — Assessment & Plan Note (Signed)
Has a documented history of anxiety/depression.  Increased stress as outlined.  Discussed.  Desires to hold on medication.  Discussed counseling.  Is sleeping.  Check routine labs.  Follow.  ?

## 2021-10-23 NOTE — Assessment & Plan Note (Signed)
Increased fatigue and stress as outlined.  Discussed.  Check routine labs.  Wants to hold on medication.  Is sleeping.   ?

## 2021-10-23 NOTE — Assessment & Plan Note (Signed)
Constant headache as outlined.  Not severe.  Discussed her neck issues.  Check c-spine.  Start mag oxide.  Follow.  ?

## 2021-10-23 NOTE — Telephone Encounter (Signed)
Rx sent in for tizanidine. This is a muscle relaxer to be taken in the evening.  Do not take and go driving or operate any machinery.  Also, do not take with any other sedating medication.  ?

## 2021-10-24 ENCOUNTER — Ambulatory Visit
Admission: RE | Admit: 2021-10-24 | Discharge: 2021-10-24 | Disposition: A | Discharge status: Home / Self Care | Payer: 59 | Source: Ambulatory Visit

## 2021-10-24 DIAGNOSIS — N6320 Unspecified lump in the left breast, unspecified quadrant: Secondary | ICD-10-CM | POA: Diagnosis not present

## 2021-10-24 DIAGNOSIS — R922 Inconclusive mammogram: Secondary | ICD-10-CM | POA: Diagnosis not present

## 2021-10-30 ENCOUNTER — Ambulatory Visit: Payer: 59 | Attending: Internal Medicine

## 2021-10-30 DIAGNOSIS — M542 Cervicalgia: Secondary | ICD-10-CM | POA: Insufficient documentation

## 2021-10-30 DIAGNOSIS — R250 Abnormal head movements: Secondary | ICD-10-CM | POA: Insufficient documentation

## 2021-10-30 DIAGNOSIS — R293 Abnormal posture: Secondary | ICD-10-CM | POA: Insufficient documentation

## 2021-10-30 NOTE — Therapy (Addendum)
[place] [facility] ?[address] ?[place], [place], [address] ?Phone: [phone]   Fax:  [phone] ? ?Physical Therapy Evaluation ? ?Patient Details  ?Name: Audrey Ortega ?MRN: [id number] ?Date of Birth: March 09, 1976 ?Referring Provider (PT): Dale [place], MD (PCP) ? ? ?Encounter Date: 10/30/2021 ? ? PT End of Session - 10/30/21 1723   ? ? Visit Number 1   ? Number of Visits 12   ? Date for PT Re-Evaluation 12/11/21   ? Authorization Type [employer] Employee Insurance- U[employer]   ? Authorization Time Period 10/30/21-12/11/21   ? Progress Note Due on Visit 10   ? PT Start Time 0730   ? PT Stop Time 0825   ? PT Time Calculation (min) 55 min   ? Activity Tolerance Patient tolerated treatment well;No increased pain;Patient limited by fatigue   ? Behavior During Therapy [facility] for tasks assessed/performed   ? ?  ?  ? ?  ? ? ?Past Medical History:  ?Diagnosis Date  ? Allergy   ? Anxiety   ? Chicken pox   ? Depression   ? Emphysema of lung (HCC)   ? Frequent headaches   ? Hypothyroidism   ? Migraine   ? ? ?Past Surgical History:  ?Procedure Laterality Date  ? NO PAST SURGERIES    ? ? ?There were no vitals filed for this visit. ? ? ? Subjective Assessment - 10/30/21 0751   ? ? Subjective Pt reports increasing difficulty with her neck feeling painful and fatigued. She works from home and in office for [employer] on computer much of the day. Pt has tried some chiropractic without much longterm relief.   ? Pertinent History Audrey Ortega is a 45yoF referred to OPPT for ongoing insidious neck discomfort. Pt reports life-long neck tightness, but over the past 6 months increased discomfort and neck fatigue have impacted her quality of life and tolerance to working duties. 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 Pain is central posterior along spinous processess more proximal  C3-T1. Pt denies more than a handful of episodic paresthesias, but endorses now resolved Right shoulder dysfunction.Pain begins about 30 minutes into work day and progressively gets worse. Pt referred by PCP, has had recent cervical xrays compared to remote 2009 cervical MRI.   ? How long can you sit comfortably? ~1 hour into work shift; progressivley worse throughout workday   ? How long can you stand comfortably? ok   ? How long can you walk comfortably? ok   ? Diagnostic tests xrays recently, remote MRI cervical 2009; 10/22/21 cervical xray: "Disc space narrowing and endplate spur formation at C5-C6 and to lesser degree C4-C5."   ? Patient Stated Goals be able to perform work without exacerbation;   ? Currently in Pain? Yes   ? Pain Score --   central C spine SP from C3-T1  ? Pain Radiating Towards up toward head, central neck   ? Aggravating Factors  desk job duties, pain during sleep once aggravated   ? ?  ?  ? ?  ? ? ? ? ? [facility] PT Assessment - 10/30/21 0001   ? ?  ? Assessment  ? Medical Diagnosis Neck pain/fatigue   ? Referring Provider (PT) Dale [place], MD   PCP  ? Onset Date/Surgical Date --   > 6 months ago  ? Hand Dominance  Right   ? Next MD Visit --   followup as needed  ? Prior Therapy chiropractic   ?  ? Precautions  ? Precautions None   ?  ? Balance Screen  ? Has the patient fallen in the past 6 months No   ? Has the patient had a decrease in activity level because of a fear of falling?  No   ? Is the patient reluctant to leave their home because of a fear of falling?  No   ?  ? Observation/Other Assessments  ? Focus on Therapeutic Outcomes (FOTO)  44   ? ?  ?  ? ?  ? ? ? ?Tests and Measures ? ?Cervical ROM 10/30/21 Right  Left  ?Cervical Rotation A/ROM 65* 62*  ?Cervical Rotation P/ROM 65* 50*  ?Capital Rotation [id number]*  ?Cervical Lateral Flexion  28* 33&  ?Cervical Extension 33 Left deviation  ?Cervical Flexion 2 fingers chin to sternum  ?Seated thoracic rotation 60 50&  ?*postural default in supine  to Right capital lateral flexion (C0/C1), remains with supine head lift  ?*=end-range pain ?&=stretchiness/tightness ? ?Orthostatic hypotension screening: ?*Patient self report of presyncope lightheadedness and visual changes with increased vertical positioning, reported as intermittent over the past 6 months ?Supine: 113/28mmHg  63bpm  ?Seated: 126/76 67 mmHg 67bpm ?Standing: 118/84mmHg 71bpm  ?Standing x1: 113/44mmHg 79bpm  ? ? ?DNF endurance Test: 10.38sec, shaking, subjectively difficult  ? ? ? ?Objective measurements completed on examination: See above findings.  ? ?INTERVENTION THIS DATE: ?-Cervical Retraction in supine from slight flexion to neutral 10x3secH  ?*on pillow + folded towel for height  ?-DNF isometric training: slight tuck-> slight lift 10x5secH  ?-handout provided and reviewed  ? ? ? ?Access Code: [email or web address] ?URL: [email or web address] ?Date: 10/30/2021 ?Prepared by: Alvera Novel ? ?Exercises ?- Supine Cervical Retraction with Towel  - 3 x daily - 7 x weekly - 1 sets - 15 reps - 3 hold ?- Supine Deep Neck Flexor Training - Repetitions  - 3 x daily - 7 x weekly - 1 sets - 10 reps - 5 hold ? ? PT Short Term Goals - 10/30/21 1739   ? ?  ? PT SHORT TERM GOAL #1  ? Title After 3 weeks patient to demonstrate improved DMF endurance test greater than 20 seconds.   ? Baseline Eval: 11 seconds   ? Time 3   ? Period Weeks   ? Status New   ? Target Date 11/20/21   ?  ? PT SHORT TERM GOAL #2  ? Title After 3 weeks patient to report ability to work greater than 1 hour prior to onset of neck symptoms on a typical workday.   ? Baseline Eval: Onset symptoms after   ? Time 3   ? Period Weeks   ? Status New   ? Target Date 11/20/21   ?  ? PT SHORT TERM GOAL #3  ? Title Patient to demonstrate independence and correct form with HEP for improving cervical strength and endurance for motor control training.   ? Baseline Eval: HEP issued on visit 1   ? Time 3   ? Period Weeks   ? Status New   ? Target  Date 11/20/21   ? ?  ?  ? ?  ? ? PT Long Term Goals - 10/30/21 1741   ? ?  ? PT LONG TERM GOAL #1  ? Title At 6 weeks patient to improve FOTO score greater than 15 points to  demonstrate improved perception of ease in performing basic mobility for ADL and IADL.   ? Baseline Eval: 44   ? Time 6   ? Period Weeks   ? Status New   ? Target Date 12/11/21   ?  ? PT LONG TERM GOAL #2  ? Title After 6 weeks patient to report ability to work 4 hours or more prior to onset or exacerbation of neck symptoms.   ? Baseline Eval: 30 minutes   ? Time 6   ? Period Weeks   ? Status New   ? Target Date 12/11/21   ?  ? PT LONG TERM GOAL #3  ? Title Patient to report no pain returning to neutral from endrange head and neck movements.   ? Time 6   ? Period Weeks   ? Status New   ? Target Date 12/11/21   ?  ? PT LONG TERM GOAL #4  ? Title Patient to demonstrate DNF endurance test greater than 30 seconds to indicate improved balance between deep postural motor systems and superficial global motor systems and cervical spine.   ? Baseline Eval: 11 seconds   ? Time 8   ? Period Weeks   ? Status New   ? Target Date 12/11/21   ? ?  ?  ? ?  ? ? ? ? ? Plan - 10/30/21 1724   ? ? Clinical Impression Statement Patient is a [age] YOF referred for chronic neck discomfort and fatigue.  Both subjective report as well as examination are supportive of motor control deficits of the cervical spine, weakness of the deep neck flexors, and significant impairment of endurance in these most muscles limiting their ability to stabilize joints during daily head movements.  Patient started on a basic HEP at this visit to engage these muscles, and to begin motor control training and endurance building.  Patient expressed concerns regarding her busy work schedule and ability to get to higher frequency of therapy visits, ultimately Chartered loss adjusterauthor and patient agreed to plan on a transition to 1 time per week scheduling once her home program is well established and proven to be  beneficial.  Patient will benefit from skilled PT intervention to improve strength and endurance and better control in the cervical spine in order to reduce pain and improve activity tolerance to work-rel

## 2021-11-05 DIAGNOSIS — E039 Hypothyroidism, unspecified: Secondary | ICD-10-CM | POA: Diagnosis not present

## 2021-11-05 DIAGNOSIS — E559 Vitamin D deficiency, unspecified: Secondary | ICD-10-CM | POA: Diagnosis not present

## 2021-11-13 ENCOUNTER — Encounter: Payer: Self-pay | Admitting: Internal Medicine

## 2021-11-13 NOTE — Telephone Encounter (Signed)
I can work her in for a virtual next Tuesday 2:30.  Let me know if this is a problem.  Confirm ok to wait until appt ?

## 2021-11-13 NOTE — Telephone Encounter (Signed)
S/w pt ?Has scheduled appointment 5/16 - will wait until then. ?

## 2021-11-22 ENCOUNTER — Ambulatory Visit: Payer: 59 | Admitting: Physical Therapy

## 2021-12-03 ENCOUNTER — Ambulatory Visit (INDEPENDENT_AMBULATORY_CARE_PROVIDER_SITE_OTHER): Payer: 59 | Admitting: Internal Medicine

## 2021-12-03 DIAGNOSIS — F32A Anxiety disorder, unspecified: Secondary | ICD-10-CM

## 2021-12-03 DIAGNOSIS — E039 Hypothyroidism, unspecified: Secondary | ICD-10-CM | POA: Diagnosis not present

## 2021-12-03 DIAGNOSIS — F419 Anxiety disorder, unspecified: Secondary | ICD-10-CM | POA: Diagnosis not present

## 2021-12-03 MED ORDER — BUSPIRONE HCL 5 MG PO TABS: 5.0000 mg | ORAL_TABLET | Freq: Two times a day (BID) | ORAL | 1 refills | Status: DC

## 2021-12-03 NOTE — Progress Notes (Addendum)
Patient ID: Audrey Ortega, female   DOB: 1975-10-13, 46 y.o.   MRN: [id number]   Virtual Visit via telephone Note  All issues noted in this document were discussed and addressed.  No physical exam was performed (except for noted visual exam findings with Video Visits).   I connected with Karl Ito  telephone and verified that I am speaking with the correct person using two identifiers. Location patient: home Location provider: work  Persons participating in the virtual visit: patient, provider  The limitations, risks, security and privacy concerns of performing an evaluation and management service by telephone and the availability of in person appointments have been discussed.  It has also been discussed with the patient that there may be a patient responsible charge related to this service. The patient expressed understanding and agreed to proceed.   Reason for visit: follow up appt  HPI: Increased stress and work and in her personal life.  Has good support at work, but still increased stress.  Trying to hire new people.  Discussed treatment.  Discussed counseling and medication.  Previously on prozac in the past.  Worked well for her.  Has talked with her family.  She is sleeping.  Eating.  Had a panic attack last week.  No nausea or vomiting reported.  Apparently has also tried buspar, zoloft and lexapro.     ROS: See pertinent positives and negatives per HPI.  Past Medical History:  Diagnosis Date   Allergy    Anxiety    Chicken pox    Depression    Emphysema of lung (HCC)    Frequent headaches    Hypothyroidism    Migraine     Past Surgical History:  Procedure Laterality Date   NO PAST SURGERIES      Family History  Problem Relation Age of Onset   Arthritis Mother    Hypertension Mother    Diabetes Mother    Arthritis Father    Hypertension Father    Hyperlipidemia Father    Thyroid disease Father        Hypothyroidism   Healthy Daughter    Breast cancer  Maternal Aunt 83   Cancer Maternal Aunt        Breast   Alcohol abuse Maternal Grandmother    Diabetes Paternal Grandmother    Hypertension Paternal Grandmother    Diabetes Paternal Grandfather    Alcohol abuse Paternal Grandfather    Hypertension Paternal Grandfather    Heart attack Brother    Hyperlipidemia Brother    Stroke Brother     SOCIAL HX: reviewed.    Current Outpatient Medications:    albuterol (VENTOLIN HFA) 108 (90 Base) MCG/ACT inhaler, Inhale 1-2 puffs into the lungs every 6 (six) hours as needed for wheezing or shortness of breath., Disp: 18 g, Rfl: 6   busPIRone (BUSPAR) 5 MG tablet, Take 1 tablet (5 mg total) by mouth 2 (two) times daily., Disp: 60 tablet, Rfl: 1   levothyroxine (SYNTHROID) 75 MCG tablet, Take 75 mcg by mouth daily before breakfast., Disp: , Rfl:    loratadine (CLARITIN) 10 MG tablet, Take 10 mg by mouth daily., Disp: , Rfl:    Magnesium Oxide 400 MG CAPS, Take one capsule per day, Disp: 30 capsule, Rfl: 2   ondansetron (ZOFRAN ODT) 8 MG disintegrating tablet, Take 1 tablet (8 mg total) by mouth every 8 (eight) hours as needed for nausea or vomiting., Disp: 20 tablet, Rfl: 0   tiZANidine (ZANAFLEX) 4 MG tablet,  Take 1 tablet (4 mg total) by mouth at bedtime as needed for muscle spasms., Disp: 20 tablet, Rfl: 0  EXAM:  GENERAL: alert, oriented. Sounds to be in no acute distress.  Answering questions appropriately.   PSYCH/NEURO: pleasant and cooperative, no obvious depression or anxiety, speech and thought processing grossly intact  ASSESSMENT AND PLAN:  Discussed the following assessment and plan:  Problem List Items Addressed This Visit     Anxiety and depression    Increased stress/anxiety as outlined.  Discussed treatment.  Discussed medications.  buspar as directed.  Follow. Call with update.  Get her back in soon to reassess.        Relevant Medications   busPIRone (BUSPAR) 5 MG tablet   Hypothyroid    On synthroid.  Follow tsh.          Return in about 7 weeks (around 01/21/2022) for follow up appt ( ) - 6-8 weeks.  .   I discussed the assessment and treatment plan with the patient. The patient was provided an opportunity to ask questions and all were answered. The patient agreed with the plan and demonstrated an understanding of the instructions.   The patient was advised to call back or seek an in-person evaluation if the symptoms worsen or if the condition fails to improve as anticipated.  I provided 18 minutes of non-face-to-face time during this encounter.   Dale [place], MD

## 2021-12-15 ENCOUNTER — Encounter: Payer: Self-pay | Admitting: Internal Medicine

## 2021-12-15 NOTE — Assessment & Plan Note (Addendum)
Increased stress/anxiety as outlined.  Discussed treatment.  Discussed medications.  buspar as directed.  Follow. Call with update.  Get her back in soon to reassess.

## 2021-12-15 NOTE — Assessment & Plan Note (Addendum)
On synthroid.  Follow tsh.   

## 2021-12-18 ENCOUNTER — Encounter: Payer: Self-pay | Admitting: Family

## 2021-12-18 ENCOUNTER — Ambulatory Visit: Payer: 59 | Admitting: Internal Medicine

## 2021-12-18 ENCOUNTER — Ambulatory Visit: Payer: 59 | Admitting: Family

## 2021-12-18 ENCOUNTER — Other Ambulatory Visit: Payer: Self-pay

## 2021-12-18 VITALS — BP 118/78 | HR 99 | Temp 98.7°F | Ht 67.0 in | Wt 143.4 lb

## 2021-12-18 DIAGNOSIS — B029 Zoster without complications: Secondary | ICD-10-CM | POA: Diagnosis not present

## 2021-12-18 MED ORDER — ALBUTEROL SULFATE HFA 108 (90 BASE) MCG/ACT IN AERS: 1.0000 | INHALATION_SPRAY | Freq: Four times a day (QID) | RESPIRATORY_TRACT | 6 refills | Status: DC | PRN

## 2021-12-18 MED ORDER — TRAMADOL HCL 50 MG PO TABS: 50.0000 mg | ORAL_TABLET | Freq: Three times a day (TID) | ORAL | 0 refills | Status: AC | PRN

## 2021-12-18 MED ORDER — VALACYCLOVIR HCL 1 G PO TABS: 1000.0000 mg | ORAL_TABLET | Freq: Three times a day (TID) | ORAL | 0 refills | Status: DC

## 2021-12-18 NOTE — Progress Notes (Signed)
Subjective:    Patient ID: Audrey Ortega, female    DOB: 31-Mar-1976, 46 y.o.   MRN: [id number]  CC: Audrey Ortega is a 46 y.o. female who presents today for an acute visit.    HPI: Complains of left medial painful thigh rash.  Noticed first 6 days ago Started bothering her 3 days ago with burning, worsening pain. She has noted vesicular lesion.   Remind her of shingles which she has had in the past on left torso.   No fever, chills, diarrhea, vomiting  She has tried gold bond, benadryl without relief.   She thought perhaps from sitting outside an insect had bitten her. No one else in household has rash No prior history of overdose.  She has used Percocet in the past     Smoker HISTORY:  Past Medical History:  Diagnosis Date   Allergy    Anxiety    Chicken pox    Depression    Emphysema of lung (HCC)    Frequent headaches    Hypothyroidism    Migraine    Past Surgical History:  Procedure Laterality Date   NO PAST SURGERIES     Family History  Problem Relation Age of Onset   Arthritis Mother    Hypertension Mother    Diabetes Mother    Arthritis Father    Hypertension Father    Hyperlipidemia Father    Thyroid disease Father        Hypothyroidism   Healthy Daughter    Breast cancer Maternal Aunt 52   Cancer Maternal Aunt        Breast   Alcohol abuse Maternal Grandmother    Diabetes Paternal Grandmother    Hypertension Paternal Grandmother    Diabetes Paternal Grandfather    Alcohol abuse Paternal Grandfather    Hypertension Paternal Grandfather    Heart attack Brother    Hyperlipidemia Brother    Stroke Brother     Allergies: Amoxicillin-pot clavulanate Current Outpatient Medications on File Prior to Visit  Medication Sig Dispense Refill   busPIRone (BUSPAR) 5 MG tablet Take 1 tablet (5 mg total) by mouth 2 (two) times daily. 60 tablet 1   levothyroxine (SYNTHROID) 75 MCG tablet Take 75 mcg by mouth daily before breakfast.     loratadine  (CLARITIN) 10 MG tablet Take 10 mg by mouth daily.     Magnesium Oxide 400 MG CAPS Take one capsule per day 30 capsule 2   ondansetron (ZOFRAN ODT) 8 MG disintegrating tablet Take 1 tablet (8 mg total) by mouth every 8 (eight) hours as needed for nausea or vomiting. 20 tablet 0   tiZANidine (ZANAFLEX) 4 MG tablet Take 1 tablet (4 mg total) by mouth at bedtime as needed for muscle spasms. 20 tablet 0   No current facility-administered medications on file prior to visit.    Social History   Tobacco Use   Smoking status: Every Day    Packs/day: 1.00    Years: 20.00    Pack years: 20.00    Types: Cigarettes   Smokeless tobacco: Never  Vaping Use   Vaping Use: Never used  Substance Use Topics   Alcohol use: Yes    Comment: Rare use   Drug use: No    Review of Systems  Constitutional:  Negative for chills and fever.  Respiratory:  Negative for cough.   Cardiovascular:  Negative for chest pain and palpitations.  Gastrointestinal:  Negative for nausea and vomiting.  Skin:  Positive for rash.     Objective:    BP 118/78 (BP Location: Left Arm, Patient Position: Sitting, Cuff Size: Normal)   Pulse 99   Temp 98.7 F (37.1 C) (Oral)   Ht 5\' 7"  (1.702 m)   Wt 143 lb 6.4 oz (65 kg)   LMP 11/20/2021 (Approximate)   SpO2 (!) 76%   BMI 22.46 kg/m    Physical Exam Vitals reviewed.  Constitutional:      Appearance: She is well-developed.  Eyes:     Conjunctiva/sclera: Conjunctivae normal.  Cardiovascular:     Rate and Rhythm: Normal rate and regular rhythm.     Pulses: Normal pulses.     Heart sounds: Normal heart sounds.  Pulmonary:     Effort: Pulmonary effort is normal.     Breath sounds: Normal breath sounds. No wheezing, rhonchi or rales.  Skin:    General: Skin is warm and dry.          Comments: Multiple separate vesicular lesions noted left medial thigh.  No red streaks, erythema, edema.  No purulent discharge  Neurological:     Mental Status: She is alert.   Psychiatric:        Speech: Speech normal.        Behavior: Behavior normal.        Thought Content: Thought content normal.       Assessment & Plan:   Problem List Items Addressed This Visit       Other   Herpes zoster - Primary    Presentation most consistent with herpes zoster.  Start Valtrex 1000 mg.  She is in quite a bit of pain and I provided her with tramadol 50 mg to use sparingly.  Counseled on the risk of addiction, overdose.  She has intolerance to NSAIDs due to prior history of PUD.  Advised that she may use capsaicin OTC once vesicular lesions are healed. I looked up patient on [place] Controlled Substances Reporting System PMP AWARE and saw no activity that raised concern of inappropriate use.  Counseled her on letting me know if rash fails to respond and most certainly if it were to extend towards the genital area.  She will let me know how she is doing.       Relevant Medications   traMADol (ULTRAM) 50 MG tablet   valACYclovir (VALTREX) 1000 MG tablet      I am having 01/20/2022 start on traMADol and valACYclovir. I am also having her maintain her loratadine, ondansetron, levothyroxine, Magnesium Oxide, tiZANidine, and busPIRone.   Meds ordered this encounter  Medications   traMADol (ULTRAM) 50 MG tablet    Sig: Take 1 tablet (50 mg total) by mouth every 8 (eight) hours as needed for up to 5 days.    Dispense:  10 tablet    Refill:  0    Order Specific Question:   Supervising Provider    Answer:   Audrey Ortega [[id number]]   valACYclovir (VALTREX) 1000 MG tablet    Sig: Take 1 tablet (1,000 mg total) by mouth 3 (three) times daily.    Dispense:  21 tablet    Refill:  0    Order Specific Question:   Supervising Provider    Answer:   Sherlene Shams [[id number]]    Return precautions given.   Risks, benefits, and alternatives of the medications and treatment plan prescribed today were discussed, and patient expressed understanding.   Education regarding  symptom management and  diagnosis given to patient on AVS.  Continue to follow with Dale DurhamScott, Charlene, MD for routine health maintenance.   Audrey MassonAmanda W Siglin and I agreed with plan.   Rennie PlowmanMargaret Priscille Shadduck, FNP

## 2021-12-18 NOTE — Assessment & Plan Note (Signed)
Presentation most consistent with herpes zoster.  Start Valtrex 1000 mg.  She is in quite a bit of pain and I provided her with tramadol 50 mg to use sparingly.  Counseled on the risk of addiction, overdose.  She has intolerance to NSAIDs due to prior history of PUD.  Advised that she may use capsaicin OTC once vesicular lesions are healed. I looked up patient on [place] Controlled Substances Reporting System PMP AWARE and saw no activity that raised concern of inappropriate use.  Counseled her on letting me know if rash fails to respond and most certainly if it were to extend towards the genital area.  She will let me know how she is doing.

## 2021-12-18 NOTE — Patient Instructions (Signed)
Start Valtrex for suspected shingles.  I have also given you a few tablets of tramadol which is an opioid like medication to use sparingly for pain.  Please do not take tramadol with any other sedating medication including a muscle relaxant or with alcohol. After blisters have healed, you may use over-the-counter capsaicin which is made from hot peppers and is used for nerve pain.  Please be careful with application of this topical medication  as it is close to the vaginal area and ensure that you wash hands diligently after application to ensure that you do not get in your eyes or mouth. Please let me know certainly if rash does not respond to antiviral or starts to get closer to genital area.

## 2021-12-25 ENCOUNTER — Other Ambulatory Visit: Payer: Self-pay | Admitting: Internal Medicine

## 2022-01-16 ENCOUNTER — Other Ambulatory Visit: Payer: Self-pay | Admitting: Internal Medicine

## 2022-01-16 ENCOUNTER — Telehealth: Payer: 59 | Admitting: Physician Assistant

## 2022-01-16 DIAGNOSIS — J019 Acute sinusitis, unspecified: Secondary | ICD-10-CM | POA: Diagnosis not present

## 2022-01-16 DIAGNOSIS — B9789 Other viral agents as the cause of diseases classified elsewhere: Secondary | ICD-10-CM

## 2022-01-16 MED ORDER — IPRATROPIUM BROMIDE 0.03 % NA SOLN: 2.0000 | Freq: Two times a day (BID) | NASAL | 0 refills | Status: AC

## 2022-01-16 NOTE — Progress Notes (Signed)

## 2022-01-16 NOTE — Progress Notes (Signed)
I have spent 5 minutes in review of e-visit questionnaire, review and updating patient chart, medical decision making and response to patient.   Sharnette Kitamura Cody Kathy Wares, PA-C    

## 2022-02-06 ENCOUNTER — Ambulatory Visit (INDEPENDENT_AMBULATORY_CARE_PROVIDER_SITE_OTHER): Payer: 59 | Admitting: Internal Medicine

## 2022-02-06 ENCOUNTER — Encounter: Payer: Self-pay | Admitting: Internal Medicine

## 2022-02-06 VITALS — BP 126/74 | HR 67 | Temp 98.2°F | Resp 17 | Ht 67.0 in | Wt 144.8 lb

## 2022-02-06 DIAGNOSIS — F419 Anxiety disorder, unspecified: Secondary | ICD-10-CM | POA: Diagnosis not present

## 2022-02-06 DIAGNOSIS — F32A Depression, unspecified: Secondary | ICD-10-CM

## 2022-02-06 DIAGNOSIS — Z1231 Encounter for screening mammogram for malignant neoplasm of breast: Secondary | ICD-10-CM | POA: Diagnosis not present

## 2022-02-06 DIAGNOSIS — R519 Headache, unspecified: Secondary | ICD-10-CM

## 2022-02-06 DIAGNOSIS — R928 Other abnormal and inconclusive findings on diagnostic imaging of breast: Secondary | ICD-10-CM

## 2022-02-06 DIAGNOSIS — R059 Cough, unspecified: Secondary | ICD-10-CM | POA: Diagnosis not present

## 2022-02-06 DIAGNOSIS — E039 Hypothyroidism, unspecified: Secondary | ICD-10-CM | POA: Diagnosis not present

## 2022-02-06 DIAGNOSIS — B029 Zoster without complications: Secondary | ICD-10-CM | POA: Diagnosis not present

## 2022-02-06 MED ORDER — AZITHROMYCIN 250 MG PO TABS: ORAL_TABLET | ORAL | 0 refills | Status: AC

## 2022-02-06 MED ORDER — PREDNISONE 10 MG PO TABS: ORAL_TABLET | ORAL | 0 refills | Status: DC

## 2022-02-06 NOTE — Progress Notes (Signed)
Patient ID: Audrey Ortega, female   DOB: [birth year], 46 y.o.   MRN: [id number]   Subjective:    Patient ID: Audrey Ortega, female    DOB: [birth year], 46 y.o.   MRN: [id number]   Patient here for a scheduled follow up .   HPI Here to follow up regarding increased stress, recent shingles and sinus congestion.  I last saw her 12/03/21 - increased stress.  Started buspar.  Was seen 12/18/21 - diagnosed with shingles. Treated with valtrex.  Better. Tries to stay active.  No chest pain or sob reported.  Was evaluated 01/16/22 - diagnosed with sinus infection.  Treated with mucinex and saline.  Reports persistent cough.  Reports head congestion and previous sore throat.  Previous covid negative.  Headache.  No chest pain.  Some coughing fits.  No acid relfux.  No nausea or vomiting.  Period this past week.  Taking ibuprofen and pamprin.    Past Medical History:  Diagnosis Date   Allergy    Anxiety    Chicken pox    Depression    Emphysema of lung (HCC)    Frequent headaches    Hypothyroidism    Migraine    Past Surgical History:  Procedure Laterality Date   NO PAST SURGERIES     Family History  Problem Relation Age of Onset   Arthritis Mother    Hypertension Mother    Diabetes Mother    Arthritis Father    Hypertension Father    Hyperlipidemia Father    Thyroid disease Father        Hypothyroidism   Healthy Daughter    Breast cancer Maternal Aunt 67   Cancer Maternal Aunt        Breast   Alcohol abuse Maternal Grandmother    Diabetes Paternal Grandmother    Hypertension Paternal Grandmother    Diabetes Paternal Grandfather    Alcohol abuse Paternal Grandfather    Hypertension Paternal Grandfather    Heart attack Brother    Hyperlipidemia Brother    Stroke Brother    Social History   Socioeconomic History   Marital status: Married    Spouse name: Not on file   Number of children: 1   Years of education: Not on file   Highest education level: Not on file  Occupational  History   Occupation: Research scientist (physical sciences): [employer]  Tobacco Use   Smoking status: Every Day    Packs/day: 1.00    Years: 20.00    Total pack years: 20.00    Types: Cigarettes   Smokeless tobacco: Never  Vaping Use   Vaping Use: Never used  Substance and Sexual Activity   Alcohol use: Yes    Comment: Rare use   Drug use: No   Sexual activity: Yes    Partners: Male  Other Topics Concern   Not on file  Social History Narrative   Not on file   Social Determinants of Health   Financial Resource Strain: Not on file  Food Insecurity: Not on file  Transportation Needs: Not on file  Physical Activity: Not on file  Stress: Not on file  Social Connections: Not on file     Review of Systems  Constitutional:  Negative for appetite change and unexpected weight change.  HENT:  Positive for congestion, postnasal drip and sinus pressure.   Respiratory:  Positive for cough. Negative for chest tightness and shortness of breath.   Cardiovascular:  Negative for  chest pain, palpitations and leg swelling.  Gastrointestinal:  Negative for abdominal pain, diarrhea, nausea and vomiting.  Genitourinary:  Negative for difficulty urinating and dysuria.  Musculoskeletal:  Negative for joint swelling and myalgias.  Skin:  Negative for color change and rash.  Neurological:  Positive for headaches. Negative for dizziness.  Psychiatric/Behavioral:  Negative for agitation and dysphoric mood.        Increased stress.        Objective:     BP 126/74 (BP Location: Left Arm, Patient Position: Sitting, Cuff Size: Small)   Pulse 67   Temp 98.2 F (36.8 C) (Temporal)   Resp 17   Ht 5\' 7"  (1.702 m)   Wt 144 lb 12.8 oz (65.7 kg)   SpO2 98%   BMI 22.68 kg/m  Wt Readings from Last 3 Encounters:  02/06/22 144 lb 12.8 oz (65.7 kg)  12/18/21 143 lb 6.4 oz (65 kg)  12/03/21 140 lb (63.5 kg)    Physical Exam Vitals reviewed.  Constitutional:      General: She is not in  acute distress.    Appearance: Normal appearance.  HENT:     Head: Normocephalic and atraumatic.     Right Ear: External ear normal.     Left Ear: External ear normal.  Eyes:     General: No scleral icterus.       Right eye: No discharge.        Left eye: No discharge.     Conjunctiva/sclera: Conjunctivae normal.  Neck:     Thyroid: No thyromegaly.  Cardiovascular:     Rate and Rhythm: Normal rate and regular rhythm.  Pulmonary:     Effort: No respiratory distress.     Breath sounds: Normal breath sounds. No wheezing.  Abdominal:     General: Bowel sounds are normal.     Palpations: Abdomen is soft.     Tenderness: There is no abdominal tenderness.  Musculoskeletal:        General: No swelling or tenderness.     Cervical back: Neck supple. No tenderness.  Lymphadenopathy:     Cervical: No cervical adenopathy.  Skin:    Findings: No erythema or rash.  Neurological:     Mental Status: She is alert.  Psychiatric:        Mood and Affect: Mood normal.        Behavior: Behavior normal.      Outpatient Encounter Medications as of 02/06/2022  Medication Sig   albuterol (VENTOLIN HFA) 108 (90 Base) MCG/ACT inhaler Inhale 1-2 puffs into the lungs every 6 (six) hours as needed for wheezing or shortness of breath.   azithromycin (ZITHROMAX) 250 MG tablet Take 2 tablets on day 1, then 1 tablet daily on days 2 through 5   busPIRone (BUSPAR) 5 MG tablet TAKE 1 TABLET BY MOUTH TWICE A DAY   ipratropium (ATROVENT) 0.03 % nasal spray Place 2 sprays into both nostrils every 12 (twelve) hours.   levothyroxine (SYNTHROID) 88 MCG tablet Take 88 mcg by mouth daily before breakfast.   loratadine (CLARITIN) 10 MG tablet Take 10 mg by mouth daily.   magnesium oxide (MAG-OX) 400 (240 Mg) MG tablet TAKE 1 TABLET BY MOUTH EVERY DAY   ondansetron (ZOFRAN ODT) 8 MG disintegrating tablet Take 1 tablet (8 mg total) by mouth every 8 (eight) hours as needed for nausea or vomiting.   predniSONE  (DELTASONE) 10 MG tablet Take 4 tablets x 1 day and then decrease by 1/2 tablet  per day until down to zero mg.   tiZANidine (ZANAFLEX) 4 MG tablet Take 1 tablet (4 mg total) by mouth at bedtime as needed for muscle spasms.   valACYclovir (VALTREX) 1000 MG tablet Take 1 tablet (1,000 mg total) by mouth 3 (three) times daily.   [DISCONTINUED] levothyroxine (SYNTHROID) 75 MCG tablet Take 75 mcg by mouth daily before breakfast.   No facility-administered encounter medications on file as of 02/06/2022.     Lab Results  Component Value Date   WBC 8.0 10/22/2021   HGB 13.1 10/22/2021   HCT 39.1 10/22/2021   PLT 302.0 10/22/2021   GLUCOSE 82 10/22/2021   CHOL 155 10/22/2021   TRIG 52.0 10/22/2021   HDL 46.70 10/22/2021   LDLCALC 98 10/22/2021   ALT 8 10/22/2021   AST 13 10/22/2021   NA 137 10/22/2021   K 4.3 10/22/2021   CL 104 10/22/2021   CREATININE 0.74 10/22/2021   BUN 6 10/22/2021   CO2 29 10/22/2021   TSH 3.87 10/22/2021    MM DIAG BREAST TOMO UNI LEFT  Result Date: 10/24/2021 CLINICAL DATA:  BI-RADS 3 follow-up of the LEFT breast mass, initiated October 2022 EXAM: DIGITAL DIAGNOSTIC UNILATERAL LEFT MAMMOGRAM WITH TOMOSYNTHESIS AND CAD; ULTRASOUND LEFT BREAST LIMITED TECHNIQUE: Left digital diagnostic mammography and breast tomosynthesis was performed. The images were evaluated with computer-aided detection.; Targeted ultrasound examination of the left breast was performed. COMPARISON:  Previous exam(s). ACR Breast Density Category c: The breast tissue is heterogeneously dense, which may obscure small masses. FINDINGS: Diagnostic images of the LEFT breast demonstrate decreased conspicuity of previously described oval circumscribed mass in the LEFT outer breast at posterior depth. No new suspicious findings are noted in the LEFT breast. Targeted ultrasound was performed of the LEFT outer breast. At 3 o'clock 8 cm from the nipple (labeled 5 cm from nipple for consistency), there is an oval  circumscribed hypoechoic mass with suggestion of an internal echogenic hilum. This measures 8 by 6 x 2 mm, previously 9 by 7 x 2 mm. There is a similar appearing additional oval circumscribed hypoechoic mass which measures 4 x 2 x 6 mm at 2:30 8 cm from the nipple. These may reflect internal mammary lymph nodes. IMPRESSION: 1. Decreased conspicuity of a probably benign LEFT breast mass mammographically. Stable sonographic appearance of a possible intramammary lymph node at 3 o'clock 8 cm from the nipple (labeled 5 cm from the nipple for consistency purposes). Recommend follow-up mammogram and ultrasound in 6 months. This will establish 1 year of definitive stability from baseline mammogram. RECOMMENDATION: Bilateral diagnostic mammogram with LEFT breast ultrasound (RIGHT breast ultrasound if deemed necessary) in 6 months. Patient is due for contralateral screening at this point in time. I have discussed the findings and recommendations with the patient. If applicable, a reminder letter will be sent to the patient regarding the next appointment. BI-RADS CATEGORY  3: Probably benign. Electronically Signed   By: Meda Klinefelter M.D.   On: 10/24/2021 16:27  US BREAST LTD UNI LEFT INC AXILLA  Result Date: 10/24/2021 CLINICAL DATA:  BI-RADS 3 follow-up of the LEFT breast mass, initiated October 2022 EXAM: DIGITAL DIAGNOSTIC UNILATERAL LEFT MAMMOGRAM WITH TOMOSYNTHESIS AND CAD; ULTRASOUND LEFT BREAST LIMITED TECHNIQUE: Left digital diagnostic mammography and breast tomosynthesis was performed. The images were evaluated with computer-aided detection.; Targeted ultrasound examination of the left breast was performed. COMPARISON:  Previous exam(s). ACR Breast Density Category c: The breast tissue is heterogeneously dense, which may obscure small masses. FINDINGS: Diagnostic  images of the LEFT breast demonstrate decreased conspicuity of previously described oval circumscribed mass in the LEFT outer breast at posterior  depth. No new suspicious findings are noted in the LEFT breast. Targeted ultrasound was performed of the LEFT outer breast. At 3 o'clock 8 cm from the nipple (labeled 5 cm from nipple for consistency), there is an oval circumscribed hypoechoic mass with suggestion of an internal echogenic hilum. This measures 8 by 6 x 2 mm, previously 9 by 7 x 2 mm. There is a similar appearing additional oval circumscribed hypoechoic mass which measures 4 x 2 x 6 mm at 2:30 8 cm from the nipple. These may reflect internal mammary lymph nodes. IMPRESSION: 1. Decreased conspicuity of a probably benign LEFT breast mass mammographically. Stable sonographic appearance of a possible intramammary lymph node at 3 o'clock 8 cm from the nipple (labeled 5 cm from the nipple for consistency purposes). Recommend follow-up mammogram and ultrasound in 6 months. This will establish 1 year of definitive stability from baseline mammogram. RECOMMENDATION: Bilateral diagnostic mammogram with LEFT breast ultrasound (RIGHT breast ultrasound if deemed necessary) in 6 months. Patient is due for contralateral screening at this point in time. I have discussed the findings and recommendations with the patient. If applicable, a reminder letter will be sent to the patient regarding the next appointment. BI-RADS CATEGORY  3: Probably benign. Electronically Signed   By: Meda Klinefelter M.D.   On: 10/24/2021 16:27      Assessment & Plan:   Problem List Items Addressed This Visit     Abnormal mammogram    Had f/u mammogram 10/2021.  Recommended 6 month f/u and due screening.        Anxiety and depression    Increased stress/anxiety as outlined.  Discussed treatment.  Buspar.  Notify me if feels needs further intervention.  Follow.       Cough    Congestion and symptoms as outlined.  Evaluated 01/16/22.  Diagnosed with viral sinusitis.  Treated with atrovent nasal srpay, saline nasal spray and mucinex.  Persistent symptoms.  Cough with coughing  fits at times.  Can continue nasal sprays.  Treat with prednisone taper as directed.  zpak as directed.  Probiotics.  Follow.  Call with update.       Headache    Mag oxide daily.  Treat current infection.  Follow.       Herpes zoster    Treated with valtrex.  Better.  Follow.       Relevant Medications   azithromycin (ZITHROMAX) 250 MG tablet   Hypothyroid    On synthroid.  Follow tsh.       Relevant Medications   levothyroxine (SYNTHROID) 88 MCG tablet   Other Visit Diagnoses     Encounter for screening mammogram for malignant neoplasm of breast    -  Primary        Dale Richland, MD

## 2022-02-10 ENCOUNTER — Encounter: Payer: Self-pay | Admitting: Internal Medicine

## 2022-02-10 DIAGNOSIS — R059 Cough, unspecified: Secondary | ICD-10-CM | POA: Insufficient documentation

## 2022-02-10 NOTE — Assessment & Plan Note (Signed)
Increased stress/anxiety as outlined.  Discussed treatment.  Buspar.  Notify me if feels needs further intervention.  Follow.

## 2022-02-10 NOTE — Assessment & Plan Note (Signed)
Congestion and symptoms as outlined.  Evaluated 01/16/22.  Diagnosed with viral sinusitis.  Treated with atrovent nasal srpay, saline nasal spray and mucinex.  Persistent symptoms.  Cough with coughing fits at times.  Can continue nasal sprays.  Treat with prednisone taper as directed.  zpak as directed.  Probiotics.  Follow.  Call with update.

## 2022-02-10 NOTE — Assessment & Plan Note (Signed)
Mag oxide daily.  Treat current infection.  Follow.

## 2022-02-10 NOTE — Assessment & Plan Note (Signed)
On synthroid.  Follow tsh.   

## 2022-02-10 NOTE — Assessment & Plan Note (Signed)
Treated with valtrex.  Better.  Follow.

## 2022-02-10 NOTE — Assessment & Plan Note (Signed)
Had f/u mammogram 10/2021.  Recommended 6 month f/u and due screening.

## 2022-02-19 ENCOUNTER — Encounter: Payer: Self-pay | Admitting: Internal Medicine

## 2022-02-19 NOTE — Telephone Encounter (Signed)
I called and spoke with patient to see if she could come in this afternoon for appointment with Dr. French Ana. Patient could come in today but is scheduled for tomorrow at 2p to address possible yeast infection.

## 2022-02-20 ENCOUNTER — Ambulatory Visit: Payer: 59 | Admitting: Internal Medicine

## 2022-02-20 ENCOUNTER — Other Ambulatory Visit (HOSPITAL_COMMUNITY)
Admission: RE | Admit: 2022-02-20 | Discharge: 2022-02-20 | Disposition: A | Discharge status: Home / Self Care | Payer: 59 | Source: Ambulatory Visit | Attending: Internal Medicine | Admitting: Internal Medicine

## 2022-02-20 ENCOUNTER — Encounter: Payer: Self-pay | Admitting: Internal Medicine

## 2022-02-20 VITALS — BP 122/70 | HR 70 | Temp 97.6°F | Ht 67.0 in | Wt 143.4 lb

## 2022-02-20 DIAGNOSIS — N644 Mastodynia: Secondary | ICD-10-CM

## 2022-02-20 DIAGNOSIS — N760 Acute vaginitis: Secondary | ICD-10-CM | POA: Insufficient documentation

## 2022-02-20 DIAGNOSIS — B379 Candidiasis, unspecified: Secondary | ICD-10-CM

## 2022-02-20 DIAGNOSIS — R928 Other abnormal and inconclusive findings on diagnostic imaging of breast: Secondary | ICD-10-CM

## 2022-02-20 MED ORDER — FLUCONAZOLE 150 MG PO TABS: 150.0000 mg | ORAL_TABLET | Freq: Once | ORAL | 0 refills | Status: AC

## 2022-02-20 NOTE — Patient Instructions (Addendum)
Dr. Almond Lint   Phone Fax E-mail Address  [phone] Not available [address]   [address]   [place] [address]     Specialties     General Surgery      Vaginitis  Vaginitis is a condition in which the vaginal tissue swells and becomes irritated. This condition is most often caused by a change in the normal balance of bacteria and yeast that live in the vagina. This change causes an overgrowth of certain bacteria or yeast, which causes the inflammation. There are different types of vaginitis. What are the causes? The cause of this condition depends on the type of vaginitis. It can be caused by: Bacteria (bacterial vaginosis). Yeast, which is a fungus (candidiasis). A parasite (trichomoniasis vaginitis). A virus (viral vaginitis). Low hormone levels (atrophic vaginitis). Low hormone levels can occur during pregnancy, breastfeeding, or after menopause. Irritants, such as bubble baths, scented tampons, and feminine sprays (allergic vaginitis). Other factors can change the normal balance of the yeast and bacteria that live in the vagina. These include: Antibiotic medicines. Poor hygiene. Diaphragms, vaginal sponges, spermicides, birth control pills, and intrauterine devices (IUDs). Sex. Infection. Uncontrolled diabetes. A weakened body defense system (immune system). What increases the risk? This condition is more likely to develop in women who: Smoke or are exposed to secondhand smoke. Use vaginal douches, scented tampons, or scented sanitary pads. Wear tight-fitting pants or thong underwear. Use oral birth control pills or an IUD. Have sex without a condom or have multiple partners. Have an STI. Frequently use the spermicide nonoxynol-9. Eat lots of foods high in sugar or who have uncontrolled diabetes. Have low estrogen levels. Have a weakened immune system from an immune disorder or medical treatment. Are pregnant or breastfeeding. What are the signs  or symptoms? Symptoms vary depending on the cause of the vaginitis. Common symptoms include: Abnormal vaginal discharge. The discharge is white, gray, or yellow with bacterial vaginosis. The discharge is thick, white, and cheesy with a yeast infection. The discharge is frothy and yellow or greenish with trichomoniasis. A bad vaginal smell. The smell is fishy with bacterial vaginosis. Vaginal itching, pain, or swelling. Pain with sex. Pain or burning when urinating. Sometimes there are no symptoms. How is this diagnosed? This condition is diagnosed based on your symptoms and medical history. A physical exam, including a pelvic exam, will also be done. You may also have other tests, including: Tests to determine the pH level (acidity or alkalinity) of your vagina. A whiff test to assess the odor that results when a sample of your vaginal discharge is mixed with a potassium hydroxide solution. Tests of vaginal fluid. A sample will be examined under a microscope. How is this treated? Treatment varies depending on the type of vaginitis you have. Your treatment may include: Antibiotic creams or pills to treat bacterial vaginosis and trichomoniasis. Antifungal medicines, such as vaginal creams or suppositories, to treat a yeast infection. Medicine to ease discomfort if you have viral vaginitis. Your sexual partner should also be treated. Estrogen delivered in a cream, pill, suppository, or vaginal ring to treat atrophic vaginitis. If vaginal dryness occurs, lubricants and moisturizing creams may help. You may need to avoid scented soaps, sprays, or douches. Stopping use of a product that is causing allergic vaginitis and then using a vaginal cream to treat the symptoms. Follow these instructions at home: Lifestyle Keep your genital area clean and dry. Avoid soap, and only rinse the area with water. Do not  douche or use tampons until your health care provider says it is okay. Use sanitary pads, if  needed. Do not have sex until your health care provider approves. When you can return to sex, practice safe sex and use condoms. Wipe from front to back. This avoids the spread of bacteria from the rectum to the vagina. General instructions Take over-the-counter and prescription medicines only as told by your health care provider. If you were prescribed an antibiotic medicine, take or use it as told by your health care provider. Do not stop taking or using the antibiotic even if you start to feel better. Keep all follow-up visits. This is important. How is this prevented? Use mild, unscented products. Do not use things that can irritate the vagina, such as fabric softeners. Avoid the following products if they are scented: Feminine sprays. Detergents. Tampons. Feminine hygiene products. Soaps or bubble baths. Let air reach your genital area. To do this: Wear cotton underwear to reduce moisture buildup. Avoid wearing underwear while you sleep. Avoid wearing tight pants and underwear or nylons without a cotton panel. Avoid wearing thong underwear. Take off any wet clothing, such as bathing suits, as soon as possible. Practice safe sex and use condoms. Contact a health care provider if: You have abdominal or pelvic pain. You have a fever or chills. You have symptoms that last for more than 2-3 days. Get help right away if: You have a fever and your symptoms suddenly get worse. Summary Vaginitis is a condition in which the vaginal tissue becomes inflamed.This condition is most often caused by a change in the normal balance of bacteria and yeast that live in the vagina. Treatment varies depending on the type of vaginitis you have. Do not douche, use tampons, or have sex until your health care provider approves. When you can return to sex, practice safe sex and use condoms. This information is not intended to replace advice given to you by your health care provider. Make sure you discuss any  questions you have with your health care provider. Document Revised: 01/05/2020 Document Reviewed: 01/05/2020 Elsevier Patient Education  2023 [employer].

## 2022-02-20 NOTE — Progress Notes (Signed)
Chief Complaint  Patient presents with   Vaginitis    Pt presents for possible yeast infection and sharp pain in nipples sxs started saturday   F/u  1. C/w yeast infection just finished Abx she noted discharge on Saturday  2. C/o left nipple pain/ Mammogram and [place] 10/2021 with inframammary lymph nodes and f/u due in 6 months mammogram and [place]  Will refer to Breast surgery [facility] Dr. Donell Beers     Review of Systems  Constitutional:  Negative for weight loss.  HENT:  Negative for hearing loss.   Eyes:  Negative for blurred vision.  Respiratory:  Negative for shortness of breath.   Cardiovascular:  Negative for chest pain.  Gastrointestinal:  Negative for abdominal pain and blood in stool.  Genitourinary:  Negative for dysuria.       +vaginal discharge  Musculoskeletal:  Negative for falls and joint pain.  Skin:  Negative for rash.  Neurological:  Negative for headaches.  Psychiatric/Behavioral:  Negative for depression.    Past Medical History:  Diagnosis Date   Allergy    Anxiety    Chicken pox    Depression    Emphysema of lung (HCC)    Frequent headaches    Hypothyroidism    Migraine    Past Surgical History:  Procedure Laterality Date   NO PAST SURGERIES     Family History  Problem Relation Age of Onset   Arthritis Mother    Hypertension Mother    Diabetes Mother    Arthritis Father    Hypertension Father    Hyperlipidemia Father    Thyroid disease Father        Hypothyroidism   Healthy Daughter    Breast cancer Maternal Aunt 40   Cancer Maternal Aunt        Breast   Alcohol abuse Maternal Grandmother    Diabetes Paternal Grandmother    Hypertension Paternal Grandmother    Diabetes Paternal Grandfather    Alcohol abuse Paternal Grandfather    Hypertension Paternal Grandfather    Heart attack Brother    Hyperlipidemia Brother    Stroke Brother    Social History   Socioeconomic History   Marital status: Married    Spouse name: Not on file   Number of  children: 1   Years of education: Not on file   Highest education level: Not on file  Occupational History   Occupation: Research scientist (physical sciences): [employer]  Tobacco Use   Smoking status: Every Day    Packs/day: 1.00    Years: 20.00    Total pack years: 20.00    Types: Cigarettes   Smokeless tobacco: Never  Vaping Use   Vaping Use: Never used  Substance and Sexual Activity   Alcohol use: Yes    Comment: Rare use   Drug use: No   Sexual activity: Yes    Partners: Male  Other Topics Concern   Not on file  Social History Narrative   Not on file   Social Determinants of Health   Financial Resource Strain: Not on file  Food Insecurity: Not on file  Transportation Needs: Not on file  Physical Activity: Not on file  Stress: Not on file  Social Connections: Not on file  Intimate Partner Violence: Not on file   Current Meds  Medication Sig   albuterol (VENTOLIN HFA) 108 (90 Base) MCG/ACT inhaler Inhale 1-2 puffs into the lungs every 6 (six) hours as needed for wheezing or  shortness of breath.   fluconazole (DIFLUCAN) 150 MG tablet Take 1 tablet (150 mg total) by mouth once for 1 dose. Repeat 2nd dose in 3 days if needed   levothyroxine (SYNTHROID) 88 MCG tablet Take 88 mcg by mouth daily before breakfast.   loratadine (CLARITIN) 10 MG tablet Take 10 mg by mouth daily.   magnesium oxide (MAG-OX) 400 (240 Mg) MG tablet TAKE 1 TABLET BY MOUTH EVERY DAY   Allergies  Allergen Reactions   Amoxicillin-Pot Clavulanate Hives and Rash   No results found for this or any previous visit (from the past 2160 hour(s)). Objective  Body mass index is 22.46 kg/m. Wt Readings from Last 3 Encounters:  02/20/22 143 lb 6.4 oz (65 kg)  02/06/22 144 lb 12.8 oz (65.7 kg)  12/18/21 143 lb 6.4 oz (65 kg)   Temp Readings from Last 3 Encounters:  02/20/22 97.6 F (36.4 C) (Oral)  02/06/22 98.2 F (36.8 C) (Temporal)  12/18/21 98.7 F (37.1 C) (Oral)   BP Readings from  Last 3 Encounters:  02/20/22 122/70  02/06/22 126/74  12/18/21 118/78   Pulse Readings from Last 3 Encounters:  02/20/22 70  02/06/22 67  12/18/21 99    Physical Exam Vitals and nursing note reviewed.  Constitutional:      Appearance: Normal appearance. She is well-developed and well-groomed.  HENT:     Head: Normocephalic and atraumatic.  Eyes:     Conjunctiva/sclera: Conjunctivae normal.     Pupils: Pupils are equal, round, and reactive to light.  Cardiovascular:     Rate and Rhythm: Normal rate and regular rhythm.     Heart sounds: Normal heart sounds. No murmur heard. Pulmonary:     Effort: Pulmonary effort is normal.     Breath sounds: Normal breath sounds.  Abdominal:     General: Abdomen is flat. Bowel sounds are normal.     Tenderness: There is no abdominal tenderness.  Genitourinary:    Exam position: Supine.     Pubic Area: Rash present.     Labia:        Right: No rash.        Left: No rash.      Vagina: No signs of injury.     Cervix: Discharge present.  Musculoskeletal:        General: No tenderness.  Skin:    General: Skin is warm and dry.  Neurological:     General: No focal deficit present.     Mental Status: She is alert and oriented to person, place, and time. Mental status is at baseline.     Cranial Nerves: Cranial nerves 2-12 are intact.     Gait: Gait is intact.  Psychiatric:        Attention and Perception: Attention and perception normal.        Mood and Affect: Mood and affect normal.        Speech: Speech normal.        Behavior: Behavior normal. Behavior is cooperative.        Thought Content: Thought content normal.        Cognition and Memory: Cognition and memory normal.        Judgment: Judgment normal.     Assessment  Plan  Yeast infection - Plan: fluconazole (DIFLUCAN) 150 MG tablet, Cervicovaginal ancillary only( [facility]) Acute vaginitis - Plan: fluconazole (DIFLUCAN) 150 MG tablet, Cervicovaginal ancillary only( [facility])   Nipple pain - Plan: Ambulatory referral to General  Surgery Dx mammogram and [place] due in 04/2022 will discuss orders with PCP to place    Provider: Dr. French Ana McLean-Scocuzza-Internal Medicine

## 2022-02-24 ENCOUNTER — Other Ambulatory Visit: Payer: Self-pay | Admitting: Family

## 2022-02-24 DIAGNOSIS — B9689 Other specified bacterial agents as the cause of diseases classified elsewhere: Secondary | ICD-10-CM

## 2022-02-24 LAB — CERVICOVAGINAL ANCILLARY ONLY
Bacterial Vaginitis (gardnerella): POSITIVE — AB
Candida Glabrata: NEGATIVE
Candida Vaginitis: NEGATIVE
Chlamydia: NEGATIVE
Comment: NEGATIVE
Comment: NEGATIVE
Comment: NEGATIVE
Comment: NEGATIVE
Comment: NEGATIVE
Comment: NORMAL
Neisseria Gonorrhea: NEGATIVE
Trichomonas: NEGATIVE

## 2022-02-24 MED ORDER — METRONIDAZOLE 500 MG PO TABS
500.0000 mg | ORAL_TABLET | Freq: Two times a day (BID) | ORAL | 0 refills | Status: DC
Start: 2022-02-24 — End: 2022-05-09

## 2022-04-11 ENCOUNTER — Ambulatory Visit
Admission: RE | Admit: 2022-04-11 | Discharge: 2022-04-11 | Disposition: A | Discharge status: Home / Self Care | Payer: 59 | Source: Ambulatory Visit | Attending: Internal Medicine | Admitting: Internal Medicine

## 2022-04-11 DIAGNOSIS — N644 Mastodynia: Secondary | ICD-10-CM

## 2022-04-11 DIAGNOSIS — R928 Other abnormal and inconclusive findings on diagnostic imaging of breast: Secondary | ICD-10-CM

## 2022-04-17 ENCOUNTER — Other Ambulatory Visit: Payer: Self-pay | Admitting: Internal Medicine

## 2022-05-09 ENCOUNTER — Ambulatory Visit: Payer: 59 | Admitting: Internal Medicine

## 2022-05-09 ENCOUNTER — Encounter: Payer: Self-pay | Admitting: Internal Medicine

## 2022-05-09 VITALS — BP 80/60 | HR 77 | Temp 97.5°F | Resp 17 | Ht 67.0 in | Wt 151.5 lb

## 2022-05-09 DIAGNOSIS — Z124 Encounter for screening for malignant neoplasm of cervix: Secondary | ICD-10-CM

## 2022-05-09 DIAGNOSIS — R059 Cough, unspecified: Secondary | ICD-10-CM

## 2022-05-09 DIAGNOSIS — R5383 Other fatigue: Secondary | ICD-10-CM | POA: Diagnosis not present

## 2022-05-09 DIAGNOSIS — F419 Anxiety disorder, unspecified: Secondary | ICD-10-CM

## 2022-05-09 DIAGNOSIS — R519 Headache, unspecified: Secondary | ICD-10-CM | POA: Diagnosis not present

## 2022-05-09 DIAGNOSIS — E039 Hypothyroidism, unspecified: Secondary | ICD-10-CM | POA: Diagnosis not present

## 2022-05-09 DIAGNOSIS — L0292 Furuncle, unspecified: Secondary | ICD-10-CM

## 2022-05-09 DIAGNOSIS — F32A Depression, unspecified: Secondary | ICD-10-CM

## 2022-05-09 DIAGNOSIS — E785 Hyperlipidemia, unspecified: Secondary | ICD-10-CM

## 2022-05-09 MED ORDER — DOXYCYCLINE HYCLATE 100 MG PO TABS: 100.0000 mg | ORAL_TABLET | Freq: Two times a day (BID) | ORAL | 0 refills | Status: DC

## 2022-05-09 MED ORDER — PREDNISONE 10 MG PO TABS: ORAL_TABLET | ORAL | 0 refills | Status: DC

## 2022-05-09 NOTE — Patient Instructions (Signed)
Saline nasal spray - flush nose 2x/day  Nasacort nasal spray - 2 sprays each nostril one time per day.  Do this in the eveing.   Take a probiotic daily while on the antibiotic and for two weeks after completing the antibiotic.

## 2022-05-09 NOTE — Progress Notes (Signed)
Patient ID: Audrey Ortega, female   DOB: [birth year], 46 y.o.   MRN: [id number]   Subjective:    Patient ID: Audrey Ortega, female    DOB: [birth year], 46 y.o.   MRN: [id number]   Patient here for  Chief Complaint  Patient presents with   Follow-up    3 mth f/u   Cough    C/O cough & head congestion x 2 weeks-negative covid test about 10 days ago   .   HPI Here to follow up regarding increased stress and hypothyroidism. Has Buspar.  Also taking magnesium - headaches.  Saw gyn 03/25/22.  Last pap 03/21/21 - NILM, hrHPV negative.  States starting about 10 days ago - noticed sore throat.  Increased drainage.  Head congestion.  Productive cough - minimal color.  No fever.  No nausea or vomiting.  No sob reported.  Eating.  Also reports - boil - posterior leg.  Appears to have drained.     Past Medical History:  Diagnosis Date   Allergy    Anxiety    Chicken pox    Depression    Emphysema of lung (HCC)    Frequent headaches    Hypothyroidism    Migraine    Past Surgical History:  Procedure Laterality Date   NO PAST SURGERIES     Family History  Problem Relation Age of Onset   Arthritis Mother    Hypertension Mother    Diabetes Mother    Arthritis Father    Hypertension Father    Hyperlipidemia Father    Thyroid disease Father        Hypothyroidism   Healthy Daughter    Breast cancer Maternal Aunt 31   Cancer Maternal Aunt        Breast   Alcohol abuse Maternal Grandmother    Diabetes Paternal Grandmother    Hypertension Paternal Grandmother    Diabetes Paternal Grandfather    Alcohol abuse Paternal Grandfather    Hypertension Paternal Grandfather    Heart attack Brother    Hyperlipidemia Brother    Stroke Brother    Social History   Socioeconomic History   Marital status: Married    Spouse name: Not on file   Number of children: 1   Years of education: Not on file   Highest education level: Not on file  Occupational History   Occupation: Transport planner: [employer]  Tobacco Use   Smoking status: Every Day    Packs/day: 1.00    Years: 20.00    Total pack years: 20.00    Types: Cigarettes   Smokeless tobacco: Never  Vaping Use   Vaping Use: Never used  Substance and Sexual Activity   Alcohol use: Yes    Comment: Rare use   Drug use: No   Sexual activity: Yes    Partners: Male  Other Topics Concern   Not on file  Social History Narrative   Not on file   Social Determinants of Health   Financial Resource Strain: Not on file  Food Insecurity: Not on file  Transportation Needs: Not on file  Physical Activity: Not on file  Stress: Not on file  Social Connections: Not on file     Review of Systems  Constitutional:  Negative for appetite change and fever.  HENT:  Positive for congestion. Negative for sinus pressure.   Respiratory:  Positive for cough. Negative for chest tightness and shortness of breath.   Cardiovascular:  Negative for chest pain, palpitations and leg swelling.  Gastrointestinal:  Negative for abdominal pain, diarrhea, nausea and vomiting.  Genitourinary:  Negative for difficulty urinating and dysuria.  Musculoskeletal:  Negative for joint swelling and myalgias.  Skin:  Negative for color change and rash.  Neurological:  Negative for dizziness, light-headedness and headaches.  Psychiatric/Behavioral:  Negative for agitation and dysphoric mood.        Objective:     BP (!) 80/60 (BP Location: Left Arm, Patient Position: Sitting, Cuff Size: Normal)   Pulse 77   Temp (!) 97.5 F (36.4 C) (Oral)   Resp 17   Ht 5\' 7"  (1.702 m)   Wt 151 lb 8 oz (68.7 kg)   SpO2 95%   BMI 23.73 kg/m  Wt Readings from Last 3 Encounters:  05/09/22 151 lb 8 oz (68.7 kg)  02/20/22 143 lb 6.4 oz (65 kg)  02/06/22 144 lb 12.8 oz (65.7 kg)    Physical Exam Vitals reviewed.  Constitutional:      General: She is not in acute distress.    Appearance: Normal appearance.  HENT:     Head:  Normocephalic and atraumatic.     Right Ear: External ear normal.     Left Ear: External ear normal.  Eyes:     General: No scleral icterus.       Right eye: No discharge.        Left eye: No discharge.     Conjunctiva/sclera: Conjunctivae normal.  Neck:     Thyroid: No thyromegaly.  Cardiovascular:     Rate and Rhythm: Normal rate and regular rhythm.  Pulmonary:     Effort: No respiratory distress.     Breath sounds: Normal breath sounds. No wheezing.  Abdominal:     General: Bowel sounds are normal.     Palpations: Abdomen is soft.     Tenderness: There is no abdominal tenderness.  Musculoskeletal:        General: No swelling or tenderness.     Cervical back: Neck supple. No tenderness.  Lymphadenopathy:     Cervical: No cervical adenopathy.  Skin:    Findings: No erythema or rash.  Neurological:     Mental Status: She is alert.  Psychiatric:        Mood and Affect: Mood normal.        Behavior: Behavior normal.      Outpatient Encounter Medications as of 05/09/2022  Medication Sig   albuterol (VENTOLIN HFA) 108 (90 Base) MCG/ACT inhaler Inhale 1-2 puffs into the lungs every 6 (six) hours as needed for wheezing or shortness of breath.   busPIRone (BUSPAR) 5 MG tablet TAKE 1 TABLET BY MOUTH TWICE A DAY   doxycycline (VIBRA-TABS) 100 MG tablet Take 1 tablet (100 mg total) by mouth 2 (two) times daily.   ipratropium (ATROVENT) 0.03 % nasal spray Place 2 sprays into both nostrils every 12 (twelve) hours.   levothyroxine (SYNTHROID) 88 MCG tablet Take 88 mcg by mouth daily before breakfast.   loratadine (CLARITIN) 10 MG tablet Take 10 mg by mouth daily.   magnesium oxide (MAG-OX) 400 (240 Mg) MG tablet TAKE 1 TABLET BY MOUTH EVERY DAY   predniSONE (DELTASONE) 10 MG tablet Take 6 tablets x 1 day and then decrease by 1/2 tablet per day until down to zero mg.   [DISCONTINUED] metroNIDAZOLE (FLAGYL) 500 MG tablet Take 1 tablet (500 mg total) by mouth 2 (two) times daily.    [DISCONTINUED] ondansetron (ZOFRAN ODT)  8 MG disintegrating tablet Take 1 tablet (8 mg total) by mouth every 8 (eight) hours as needed for nausea or vomiting.   [DISCONTINUED] predniSONE (DELTASONE) 10 MG tablet Take 4 tablets x 1 day and then decrease by 1/2 tablet per day until down to zero mg.   [DISCONTINUED] tiZANidine (ZANAFLEX) 4 MG tablet Take 1 tablet (4 mg total) by mouth at bedtime as needed for muscle spasms.   [DISCONTINUED] valACYclovir (VALTREX) 1000 MG tablet Take 1 tablet (1,000 mg total) by mouth 3 (three) times daily.   No facility-administered encounter medications on file as of 05/09/2022.     Lab Results  Component Value Date   WBC 8.0 10/22/2021   HGB 13.1 10/22/2021   HCT 39.1 10/22/2021   PLT 302.0 10/22/2021   GLUCOSE 82 10/22/2021   CHOL 155 10/22/2021   TRIG 52.0 10/22/2021   HDL 46.70 10/22/2021   LDLCALC 98 10/22/2021   ALT 8 10/22/2021   AST 13 10/22/2021   NA 137 10/22/2021   K 4.3 10/22/2021   CL 104 10/22/2021   CREATININE 0.74 10/22/2021   BUN 6 10/22/2021   CO2 29 10/22/2021   TSH 3.87 10/22/2021    MM DIAG BREAST TOMO BILATERAL  Result Date: 04/11/2022 CLINICAL DATA:  BI-RADS 3 follow-up of 2 LEFT breast masses, initiated September 2022. Retroareolar LEFT nipple pain 3 months ago. Improving. EXAM: DIGITAL DIAGNOSTIC BILATERAL MAMMOGRAM WITH TOMOSYNTHESIS; ULTRASOUND LEFT BREAST LIMITED TECHNIQUE: Bilateral digital diagnostic mammography and breast tomosynthesis was performed.; Targeted ultrasound examination of the left breast was performed. COMPARISON:  Previous exam(s). ACR Breast Density Category c: The breast tissue is heterogeneously dense, which may obscure small masses. FINDINGS: Diagnostic images LEFT breast min straight mammographic stability of 2 oval circumscribed masses in the LEFT outer breast at posterior depth. No new suspicious findings are noted. No new suspicious findings are noted in the region of painful concern in the LEFT  retroareolar breast. No suspicious mass, distortion, or microcalcifications are identified to suggest presence of malignancy in the RIGHT breast. Targeted ultrasound was performed of the LEFT breast. At 3 o'clock 8 cm from the nipple (labeled as 3 o'clock 5 cm from the nipple for consistency purposes), there is revisualization of an oval circumscribed mass with suggestion of internal echogenic hilum. It measures 7 by 2 by 6 mm, previously 7 x 2 x 9 mm. This may reflect a benign cluster of cysts or benign intramammary lymph node. At 2:30 8 cm from the nipple, there is an oval circumscribed anechoic mass with a thin internal septation internal septation. This measures 5 x 3 x 4 mm and is consistent with a benign cluster of cysts Targeted ultrasound was performed of the LEFT retroareolar breast. No suspicious cystic or solid mass is seen at the site of painful concern. IMPRESSION: 1. Stable probably benign LEFT breast mass at 3 o'clock from the nipple which likely reflects a mildly complicated cyst versus intramammary lymph node. Recommend follow-up diagnostic mammogram and ultrasound in 1 year. This will establish over 2 years of stability. 2. There is a benign cluster of cysts in the LEFT breast at 2:30 8 cm from the nipple. 3. No mammographic or sonographic evidence of malignancy at the site of painful concern in the LEFT retroareolar breast. Any further workup of the patient's symptoms should be based on the clinical assessment. 4. No mammographic evidence of malignancy in the RIGHT breast. RECOMMENDATION: Bilateral diagnostic mammogram with LEFT breast ultrasound (RIGHT breast ultrasound as deemed necessary) in  1 year. I have discussed the findings and recommendations with the patient. If applicable, a reminder letter will be sent to the patient regarding the next appointment. BI-RADS CATEGORY  3: Probably benign. Electronically Signed   By: Meda Klinefelter M.D.   On: 04/11/2022 10:10  US BREAST LTD UNI LEFT  INC AXILLA  Result Date: 04/11/2022 CLINICAL DATA:  BI-RADS 3 follow-up of 2 LEFT breast masses, initiated September 2022. Retroareolar LEFT nipple pain 3 months ago. Improving. EXAM: DIGITAL DIAGNOSTIC BILATERAL MAMMOGRAM WITH TOMOSYNTHESIS; ULTRASOUND LEFT BREAST LIMITED TECHNIQUE: Bilateral digital diagnostic mammography and breast tomosynthesis was performed.; Targeted ultrasound examination of the left breast was performed. COMPARISON:  Previous exam(s). ACR Breast Density Category c: The breast tissue is heterogeneously dense, which may obscure small masses. FINDINGS: Diagnostic images LEFT breast min straight mammographic stability of 2 oval circumscribed masses in the LEFT outer breast at posterior depth. No new suspicious findings are noted. No new suspicious findings are noted in the region of painful concern in the LEFT retroareolar breast. No suspicious mass, distortion, or microcalcifications are identified to suggest presence of malignancy in the RIGHT breast. Targeted ultrasound was performed of the LEFT breast. At 3 o'clock 8 cm from the nipple (labeled as 3 o'clock 5 cm from the nipple for consistency purposes), there is revisualization of an oval circumscribed mass with suggestion of internal echogenic hilum. It measures 7 by 2 by 6 mm, previously 7 x 2 x 9 mm. This may reflect a benign cluster of cysts or benign intramammary lymph node. At 2:30 8 cm from the nipple, there is an oval circumscribed anechoic mass with a thin internal septation internal septation. This measures 5 x 3 x 4 mm and is consistent with a benign cluster of cysts Targeted ultrasound was performed of the LEFT retroareolar breast. No suspicious cystic or solid mass is seen at the site of painful concern. IMPRESSION: 1. Stable probably benign LEFT breast mass at 3 o'clock from the nipple which likely reflects a mildly complicated cyst versus intramammary lymph node. Recommend follow-up diagnostic mammogram and ultrasound in 1  year. This will establish over 2 years of stability. 2. There is a benign cluster of cysts in the LEFT breast at 2:30 8 cm from the nipple. 3. No mammographic or sonographic evidence of malignancy at the site of painful concern in the LEFT retroareolar breast. Any further workup of the patient's symptoms should be based on the clinical assessment. 4. No mammographic evidence of malignancy in the RIGHT breast. RECOMMENDATION: Bilateral diagnostic mammogram with LEFT breast ultrasound (RIGHT breast ultrasound as deemed necessary) in 1 year. I have discussed the findings and recommendations with the patient. If applicable, a reminder letter will be sent to the patient regarding the next appointment. BI-RADS CATEGORY  3: Probably benign. Electronically Signed   By: Meda Klinefelter M.D.   On: 04/11/2022 10:10      Assessment & Plan:   Problem List Items Addressed This Visit     Anxiety and depression    Has buspar if needed. Follow.       Boil    Reports has drained.  No surrounding erythema.  Warm compresses.  Doxycycline should cover.  Call with update.       Cervical cancer screening    Seeing Dr Dalbert Garnet.  PAP 03/2021.       Cough    Persistent cough and congestion as outlined.  Cough productive.  Treat with doxycycline.  Probiotics as directed.  Prednisone taper as  directed.  Has rescue inhaler if needed.  Saline nasal spray and nasacort. Follow.        Fatigue   Relevant Orders   Basic Metabolic Panel (BMET)   CBC w/Diff   Headache    Magnesium.       Hypothyroid    On synthroid.  Follow tsh.       Other Visit Diagnoses     Hyperlipidemia, unspecified hyperlipidemia type    -  Primary   Relevant Orders   Hepatic function panel   Lipid panel        Einar Pheasant, MD

## 2022-05-18 ENCOUNTER — Encounter: Payer: Self-pay | Admitting: Internal Medicine

## 2022-05-18 DIAGNOSIS — L0292 Furuncle, unspecified: Secondary | ICD-10-CM | POA: Insufficient documentation

## 2022-05-18 NOTE — Assessment & Plan Note (Signed)
Seeing Dr Leafy Ro.  PAP 03/2021.

## 2022-05-18 NOTE — Assessment & Plan Note (Signed)
Has buspar if needed. Follow.  

## 2022-05-18 NOTE — Assessment & Plan Note (Addendum)
Persistent cough and congestion as outlined.  Cough productive.  Treat with doxycycline.  Probiotics as directed.  Prednisone taper as directed.  Has rescue inhaler if needed.  Saline nasal spray and nasacort. Follow.

## 2022-05-18 NOTE — Assessment & Plan Note (Signed)
Reports has drained.  No surrounding erythema.  Warm compresses.  Doxycycline should cover.  Call with update.

## 2022-05-18 NOTE — Assessment & Plan Note (Signed)
On synthroid.  Follow tsh.   

## 2022-05-18 NOTE — Assessment & Plan Note (Signed)
Magnesium

## 2022-05-21 ENCOUNTER — Other Ambulatory Visit: Payer: Self-pay

## 2022-05-21 ENCOUNTER — Encounter: Payer: Self-pay | Admitting: Internal Medicine

## 2022-05-21 MED ORDER — DOXYCYCLINE HYCLATE 100 MG PO TABS: 100.0000 mg | ORAL_TABLET | Freq: Two times a day (BID) | ORAL | 0 refills | Status: DC

## 2022-05-21 NOTE — Telephone Encounter (Signed)
Please call pt - notify her that since it is better, but just not completely resolved - I am ok to extend out abx - doxycycline 100mg  bid x 1 week.  Take probiotic daily while on abx and for two weeks after completing abx.  Let me know if does not resolve.

## 2022-05-21 NOTE — Telephone Encounter (Signed)
Pt advised - rx refilled

## 2022-06-04 ENCOUNTER — Other Ambulatory Visit: Payer: Self-pay

## 2022-06-04 ENCOUNTER — Other Ambulatory Visit: Payer: 59

## 2022-06-04 ENCOUNTER — Encounter: Payer: Self-pay | Admitting: Internal Medicine

## 2022-06-04 ENCOUNTER — Ambulatory Visit (INDEPENDENT_AMBULATORY_CARE_PROVIDER_SITE_OTHER): Payer: 59

## 2022-06-04 DIAGNOSIS — R059 Cough, unspecified: Secondary | ICD-10-CM

## 2022-06-04 NOTE — Telephone Encounter (Signed)
S/w pt - agreeable to cxr today and virtual tomorrow 430 Appt's booked. Orders placed

## 2022-06-04 NOTE — Telephone Encounter (Signed)
Confirm no pregnancy.  See if can come in for cxr this pm.  Add on as virtual visit tomorrow.

## 2022-06-05 ENCOUNTER — Encounter: Payer: Self-pay | Admitting: Internal Medicine

## 2022-06-05 ENCOUNTER — Other Ambulatory Visit: Payer: Self-pay | Admitting: Internal Medicine

## 2022-06-05 ENCOUNTER — Telehealth: Payer: 59 | Admitting: Internal Medicine

## 2022-06-05 VITALS — Ht 67.0 in | Wt 151.3 lb

## 2022-06-05 DIAGNOSIS — R059 Cough, unspecified: Secondary | ICD-10-CM | POA: Diagnosis not present

## 2022-06-05 DIAGNOSIS — E039 Hypothyroidism, unspecified: Secondary | ICD-10-CM | POA: Diagnosis not present

## 2022-06-05 DIAGNOSIS — L0292 Furuncle, unspecified: Secondary | ICD-10-CM | POA: Diagnosis not present

## 2022-06-05 MED ORDER — PREDNISONE 10 MG PO TABS: ORAL_TABLET | ORAL | 0 refills | Status: DC

## 2022-06-05 MED ORDER — BENZONATATE 100 MG PO CAPS: 100.0000 mg | ORAL_CAPSULE | Freq: Three times a day (TID) | ORAL | 0 refills | Status: DC | PRN

## 2022-06-05 NOTE — Progress Notes (Signed)
Patient ID: Audrey Ortega, female   DOB: [birth year], 46 y.o.   MRN: [id number]   Virtual Visit via video Note   All issues noted in this document were discussed and addressed.  No physical exam was performed (except for noted visual exam findings with Video Visits).   I connected with Karl Ito by a video enabled telemedicine application and verified that I am speaking with the correct person using two identifiers. Location patient: home Location provider: work  Persons participating in the virtual visit: patient, provider  The limitations, risks, security and privacy concerns of performing an evaluation and management service by video and the availability of in person appointments have been discussed.  It has also been discussed with the patient that there may be a patient responsible charge related to this service. The patient expressed understanding and agreed to proceed.   Reason for visit: work in appt.   HPI: Work in - persistent cough. Was seen 05/09/22 - increased drainage, head congestion and cough.  Was treated with doxycycline and prednisone taper.  Reports head congestion better.  Still with increased cough.  Coughing fits.  Some wheezing.  Some increased drainage.  No sore throat.  No chest pain.  No vomiting or diarrhea.  Has decreased smoking.    ROS: See pertinent positives and negatives per HPI.  Past Medical History:  Diagnosis Date   Allergy    Anxiety    Chicken pox    Depression    Emphysema of lung (HCC)    Frequent headaches    Hypothyroidism    Migraine     Past Surgical History:  Procedure Laterality Date   NO PAST SURGERIES      Family History  Problem Relation Age of Onset   Arthritis Mother    Hypertension Mother    Diabetes Mother    Arthritis Father    Hypertension Father    Hyperlipidemia Father    Thyroid disease Father        Hypothyroidism   Healthy Daughter    Breast cancer Maternal Aunt 80   Cancer Maternal Aunt        Breast    Alcohol abuse Maternal Grandmother    Diabetes Paternal Grandmother    Hypertension Paternal Grandmother    Diabetes Paternal Grandfather    Alcohol abuse Paternal Grandfather    Hypertension Paternal Grandfather    Heart attack Brother    Hyperlipidemia Brother    Stroke Brother     SOCIAL HX: reviewed.    Current Outpatient Medications:    albuterol (VENTOLIN HFA) 108 (90 Base) MCG/ACT inhaler, Inhale 1-2 puffs into the lungs every 6 (six) hours as needed for wheezing or shortness of breath., Disp: 18 g, Rfl: 6   benzonatate (TESSALON PERLES) 100 MG capsule, Take 1 capsule (100 mg total) by mouth 3 (three) times daily as needed for cough., Disp: 21 capsule, Rfl: 0   busPIRone (BUSPAR) 5 MG tablet, TAKE 1 TABLET BY MOUTH TWICE A DAY, Disp: 180 tablet, Rfl: 1   ipratropium (ATROVENT) 0.03 % nasal spray, Place 2 sprays into both nostrils every 12 (twelve) hours., Disp: 30 mL, Rfl: 0   levothyroxine (SYNTHROID) 88 MCG tablet, Take 88 mcg by mouth daily before breakfast., Disp: , Rfl:    loratadine (CLARITIN) 10 MG tablet, Take 10 mg by mouth daily., Disp: , Rfl:    magnesium oxide (MAG-OX) 400 (240 Mg) MG tablet, TAKE 1 TABLET BY MOUTH EVERY DAY, Disp: 90 tablet, Rfl: 0  predniSONE (DELTASONE) 10 MG tablet, Take 6 tablets x 1 day and then decrease by 1/2 tablet per day until down to zero mg., Disp: 39 tablet, Rfl: 0  EXAM:  GENERAL: alert, oriented, appears well and in no acute distress  HEENT: atraumatic, conjunttiva clear, no obvious abnormalities on inspection of external nose and ears  NECK: normal movements of the head and neck  LUNGS: on inspection no signs of respiratory distress, no obvious gross SOB, gasping.  Increased cough with forced expiration.   CV: no obvious cyanosis  PSYCH/NEURO: pleasant and cooperative, no obvious depression or anxiety, speech and thought processing grossly intact  ASSESSMENT AND PLAN:  Discussed the following assessment and  plan:  Problem List Items Addressed This Visit     Boil    Better with doxycycline.        Cough - Primary    Persistent cough as outlined.  Coughing fits.  CXR - lungs clear. Treat with prednisone taper as directed.  Albuterol inhaler.  Tesalon perles prn.  Saline nasal spray/steroid nasal spray.  Follow.  Call with update.       Hypothyroid    On synthroid.  Follow tsh.        Return if symptoms worsen or fail to improve.   I discussed the assessment and treatment plan with the patient. The patient was provided an opportunity to ask questions and all were answered. The patient agreed with the plan and demonstrated an understanding of the instructions.   The patient was advised to call back or seek an in-person evaluation if the symptoms worsen or if the condition fails to improve as anticipated.   Dale Starbuck, MD

## 2022-06-15 ENCOUNTER — Encounter: Payer: Self-pay | Admitting: Internal Medicine

## 2022-06-15 NOTE — Assessment & Plan Note (Signed)
Persistent cough as outlined.  Coughing fits.  CXR - lungs clear. Treat with prednisone taper as directed.  Albuterol inhaler.  Tesalon perles prn.  Saline nasal spray/steroid nasal spray.  Follow.  Call with update.

## 2022-06-15 NOTE — Assessment & Plan Note (Signed)
On synthroid.  Follow tsh.   

## 2022-06-15 NOTE — Assessment & Plan Note (Signed)
Better with doxycycline.

## 2022-07-18 ENCOUNTER — Telehealth: Payer: 59 | Admitting: Family Medicine

## 2022-07-18 DIAGNOSIS — U071 COVID-19: Secondary | ICD-10-CM

## 2022-07-18 DIAGNOSIS — Z720 Tobacco use: Secondary | ICD-10-CM

## 2022-07-18 MED ORDER — BENZONATATE 200 MG PO CAPS: 200.0000 mg | ORAL_CAPSULE | Freq: Two times a day (BID) | ORAL | 0 refills | Status: DC | PRN

## 2022-07-18 MED ORDER — PREDNISONE 20 MG PO TABS: 20.0000 mg | ORAL_TABLET | Freq: Two times a day (BID) | ORAL | 0 refills | Status: AC

## 2022-07-18 NOTE — Progress Notes (Signed)
Virtual Visit Consent   Audrey Ortega, you are scheduled for a virtual visit with a [facility] provider today. Just as with appointments in the office, your consent must be obtained to participate. Your consent will be active for this visit and any virtual visit you may have with one of our providers in the next 365 days. If you have a MyChart account, a copy of this consent can be sent to you electronically.  As this is a virtual visit, video technology does not allow for your provider to perform a traditional examination. This may limit your provider's ability to fully assess your condition. If your provider identifies any concerns that need to be evaluated in person or the need to arrange testing (such as labs, EKG, etc.), we will make arrangements to do so. Although advances in technology are sophisticated, we cannot ensure that it will always work on either your end or our end. If the connection with a video visit is poor, the visit may have to be switched to a telephone visit. With either a video or telephone visit, we are not always able to ensure that we have a secure connection.  By engaging in this virtual visit, you consent to the provision of healthcare and authorize for your insurance to be billed (if applicable) for the services provided during this visit. Depending on your insurance coverage, you may receive a charge related to this service.  I need to obtain your verbal consent now. Are you willing to proceed with your visit today? Audrey Ortega has provided verbal consent on 07/18/2022 for a virtual visit (video or telephone). Georgana Curio, FNP  Date: 07/18/2022 7:39 PM  Virtual Visit via Video Note   I, Georgana Curio, connected with  Audrey Ortega  ([id number], August 12, 1975) on 07/18/22 at  7:45 PM EST by a video-enabled telemedicine application and verified that I am speaking with the correct person using two identifiers.  Location: Patient: Virtual Visit Location Patient:  Home Provider: Virtual Visit Location Provider: Home Office   I discussed the limitations of evaluation and management by telemedicine and the availability of in person appointments. The patient expressed understanding and agreed to proceed.    History of Present Illness: Audrey Ortega is a 46 y.o. who identifies as a female who was assigned female at birth, and is being seen today for sore throat, headache, cough wih positive covid test. She says she is a smoker. She si concerned about her breathing. Marland Kitchen  HPI: HPI  Problems:  Patient Active Problem List   Diagnosis Date Noted   Boil 05/18/2022   Nipple pain 02/20/2022   Cough 02/10/2022   Herpes zoster 12/18/2021   Dizziness 10/23/2021   Headache 10/23/2021   Neck pain 10/22/2021   Vitamin D deficiency 04/21/2021   Fatigue 04/21/2021   Close exposure to COVID-19 virus 04/21/2021   Epigastric pain 04/21/2021   Cervical cancer screening 04/21/2021   Abnormal mammogram 04/21/2021   Infected sebaceous cyst 06/09/2013   Tobacco use disorder 12/24/2012   Hypothyroid 12/29/2011   Anxiety and depression 12/29/2011    Allergies:  Allergies  Allergen Reactions   Amoxicillin-Pot Clavulanate Hives and Rash   Medications:  Current Outpatient Medications:    benzonatate (TESSALON) 200 MG capsule, Take 1 capsule (200 mg total) by mouth 2 (two) times daily as needed for cough., Disp: 20 capsule, Rfl: 0   predniSONE (DELTASONE) 20 MG tablet, Take 1 tablet (20 mg total) by mouth 2 (two) times daily  with a meal for 5 days., Disp: 10 tablet, Rfl: 0   albuterol (VENTOLIN HFA) 108 (90 Base) MCG/ACT inhaler, Inhale 1-2 puffs into the lungs every 6 (six) hours as needed for wheezing or shortness of breath., Disp: 18 g, Rfl: 6   benzonatate (TESSALON PERLES) 100 MG capsule, Take 1 capsule (100 mg total) by mouth 3 (three) times daily as needed for cough., Disp: 21 capsule, Rfl: 0   busPIRone (BUSPAR) 5 MG tablet, TAKE 1 TABLET BY MOUTH TWICE A DAY,  Disp: 180 tablet, Rfl: 1   ipratropium (ATROVENT) 0.03 % nasal spray, Place 2 sprays into both nostrils every 12 (twelve) hours., Disp: 30 mL, Rfl: 0   levothyroxine (SYNTHROID) 88 MCG tablet, Take 88 mcg by mouth daily before breakfast., Disp: , Rfl:    loratadine (CLARITIN) 10 MG tablet, Take 10 mg by mouth daily., Disp: , Rfl:    magnesium oxide (MAG-OX) 400 (240 Mg) MG tablet, TAKE 1 TABLET BY MOUTH EVERY DAY, Disp: 90 tablet, Rfl: 0   predniSONE (DELTASONE) 10 MG tablet, Take 6 tablets x 1 day and then decrease by 1/2 tablet per day until down to zero mg., Disp: 39 tablet, Rfl: 0  Observations/Objective: Patient is well-developed, well-nourished in no acute distress.  Resting comfortably  at home.  Head is normocephalic, atraumatic.  No labored breathing.  Speech is clear and coherent with logical content.  Patient is alert and oriented at baseline.    Assessment and Plan: 1. COVID  Increase fluids, humidifier at night, tylenol or ibuprofen as directed, mvi with vit d and zinc, quarantine discussed, urgent care if sx persist or worsen. Smoking cessation recommended.   Follow Up Instructions: I discussed the assessment and treatment plan with the patient. The patient was provided an opportunity to ask questions and all were answered. The patient agreed with the plan and demonstrated an understanding of the instructions.  A copy of instructions were sent to the patient via MyChart unless otherwise noted below.     The patient was advised to call back or seek an in-person evaluation if the symptoms worsen or if the condition fails to improve as anticipated.  Time:  I spent 10 minutes with the patient via telehealth technology discussing the above problems/concerns.    Dellia Nims, FNP

## 2022-07-18 NOTE — Patient Instructions (Signed)
COVID-19 COVID-19, or coronavirus disease 2019, is an infection that is caused by a new (novel) coronavirus called SARS-CoV-2. COVID-19 can cause many symptoms. In some people, the virus may not cause any symptoms. In others, it may cause mild or severe symptoms. Some people with severe infection develop severe disease. What are the causes? This illness is caused by a virus. The virus may be in the air as tiny specks of fluid (aerosols) or droplets, or it may be on surfaces. You may catch the virus by: Breathing in droplets from an infected person. Droplets can be spread by a person breathing, speaking, singing, coughing, or sneezing. Touching something, like a table or a doorknob, that has virus on it (is contaminated) and then touching your mouth, nose, or eyes. What increases the risk? Risk for infection: You are more likely to get infected with the COVID-19 virus if: You are within 6 ft (1.8 m) of a person with COVID-19 for 15 minutes or longer. You are providing care for a person who is infected with COVID-19. You are in close personal contact with other people. Close personal contact includes hugging, kissing, or sharing eating or drinking utensils. Risk for serious illness caused by COVID-19: You are more likely to get seriously ill from the COVID-19 virus if: You have cancer. You have a long-term (chronic) disease, such as: Chronic lung disease. This includes pulmonary embolism, chronic obstructive pulmonary disease, and cystic fibrosis. Long-term disease that lowers your body's ability to fight infection (immunocompromise). Serious cardiac conditions, such as heart failure, coronary artery disease, or cardiomyopathy. Diabetes. Chronic kidney disease. Liver diseases. These include cirrhosis, nonalcoholic fatty liver disease, alcoholic liver disease, or autoimmune hepatitis. You have obesity. You are pregnant or were recently pregnant. You have sickle cell disease. What are the signs  or symptoms? Symptoms of this condition can range from mild to severe. Symptoms may appear any time from 2 to 14 days after being exposed to the virus. They include: Fever or chills. Shortness of breath or trouble breathing. Feeling tired or very tired. Headaches, body aches, or muscle aches. Runny or stuffy nose, sneezing, coughing, or sore throat. New loss of taste or smell. This is rare. Some people may also have stomach problems, such as nausea, vomiting, or diarrhea. Other people may not have any symptoms of COVID-19. How is this diagnosed? This condition may be diagnosed by testing samples to check for the COVID-19 virus. The most common tests are the PCR test and the antigen test. Tests may be done in the lab or at home. They include: Using a swab to take a sample of fluid from the back of your nose and throat (nasopharyngeal fluid), from your nose, or from your throat. Testing a sample of saliva from your mouth. Testing a sample of coughed-up mucus from your lungs (sputum). How is this treated? Treatment for COVID-19 infection depends on the severity of the condition. Mild symptoms can be managed at home with rest, fluids, and over-the-counter medicines. Serious symptoms may be treated in a hospital intensive care unit (ICU). Treatment in the ICU may include: Supplemental oxygen. Extra oxygen is given through a tube in the nose, a face mask, or a hood. Medicines. These may include: Antivirals, such as monoclonal antibodies. These help your body fight off certain viruses that can cause disease. Anti-inflammatories, such as corticosteroids. These reduce inflammation and suppress the immune system. Antithrombotics. These prevent or treat blood clots, if they develop. Convalescent plasma. This helps boost your immune system, if   you have an underlying immunosuppressive condition or are getting immunosuppressive treatments. Prone positioning. This means you will lie on your stomach. This  helps oxygen to get into your lungs. Infection control measures. If you are at risk for more serious illness caused by COVID-19, your health care provider may prescribe two long-acting monoclonal antibodies, given together every 6 months. How is this prevented? To protect yourself: Use preventive medicine (pre-exposure prophylaxis). You may get pre-exposure prophylaxis if you have moderate or severe immunocompromise. Get vaccinated. Anyone 6 months old or older who meets guidelines can get a COVID-19 vaccine or vaccine series. This includes people who are pregnant or making breast milk (lactating). Get an added dose of COVID-19 vaccine after your first vaccine or vaccine series if you have moderate to severe immunocompromise. This applies if you have had a solid organ transplant or have been diagnosed with an immunocompromising condition. You should get the added dose 4 weeks after you got the first COVID-19 vaccine or vaccine series. If you get an mRNA vaccine, you will need a 3-dose primary series. If you get the J&J/Janssen vaccine, you will need a 2-dose primary series, with the second dose being an mRNA vaccine. Talk to your health care provider about getting experimental monoclonal antibodies. This treatment is approved under emergency use authorization to prevent severe illness before or after being exposed to the COVID-19 virus. You may be given monoclonal antibodies if: You have moderate or severe immunocompromise. This includes treatments that lower your immune response. People with immunocompromise may not develop protection against COVID-19 when they are vaccinated. You cannot be vaccinated. You may not get a vaccine if you have a severe allergic reaction to the vaccine or its components. You are not fully vaccinated. You are in a facility where COVID-19 is present and: Are in close contact with a person who is infected with the COVID-19 virus. Are at high risk of being exposed to the  COVID-19 virus. You are at risk of illness from new variants of the COVID-19 virus. To protect others: If you have symptoms of COVID-19, take steps to prevent the virus from spreading to others. Stay home. Leave your house only to get medical care. Do not use public transit, if possible. Do not travel while you are sick. Wash your hands often with soap and water for at least 20 seconds. If soap and water are not available, use alcohol-based hand sanitizer. Make sure that all people in your household wash their hands well and often. Cough or sneeze into a tissue or your sleeve or elbow. Do not cough or sneeze into your hand or into the air. Where to find more information Centers for Disease Control and Prevention: www.cdc.gov/coronavirus World Health Organization: www.who.int/health-topics/coronavirus Get help right away if: You have trouble breathing. You have pain or pressure in your chest. You are confused. You have bluish lips and fingernails. You have trouble waking from sleep. You have symptoms that get worse. These symptoms may be an emergency. Get help right away. Call 911. Do not wait to see if the symptoms will go away. Do not drive yourself to the hospital. Summary COVID-19 is an infection that is caused by a new coronavirus. Sometimes, there are no symptoms. Other times, symptoms range from mild to severe. Some people with a severe COVID-19 infection develop severe disease. The virus that causes COVID-19 can spread from person to person through droplets or aerosols from breathing, speaking, singing, coughing, or sneezing. Mild symptoms of COVID-19 can be managed   at home with rest, fluids, and over-the-counter medicines. This information is not intended to replace advice given to you by your health care provider. Make sure you discuss any questions you have with your health care provider. Document Revised: 06/25/2021 Document Reviewed: 06/27/2021 Elsevier Patient Education   2023 Elsevier Inc.    Person Under Monitoring Name: Audrey Ortega  Location: [address] [place] [address]   CORONAVIRUS DISEASE 2019 (COVID-19) Guidance for Persons Under Investigation You are being tested for the virus that causes coronavirus disease 2019 (COVID-19). Public health actions are necessary to ensure protection of your health and the health of others, and to prevent further spread of infection. COVID-19 is caused by a virus that can cause symptoms, such as fever, cough, and shortness of breath. The primary transmission from person to person is by coughing or sneezing. On August 19, 2018, the World Health Organization announced a [employer] Emergency of International Concern and on August 20, 2018 the U.S. Department of Health and Human Services declared a public health emergency. If the virus that causesCOVID-19 spreads in the community, it could have severe public health consequences.  As a person under investigation for COVID-19, the [employer] of Health and [employer], Division of [employer] advises you to adhere to the following guidance until your test results are reported to you. If your test result is positive, you will receive additional information from your provider and your local health department at that time.  Remain at home until you are cleared by your health provider or public health authorities.  Keep a log of visitors to your home using the form provided. Any visitors to your home must be aware of your isolation status. If you plan to move to a new address or leave the county, notify the local health department in your county. Call a doctor or seek care if you have an urgent medical need. Before seeking medical care, call ahead and get instructions from the provider before arriving at the medical office, clinic or hospital. Notify them that you are being tested for the virus that causes COVID-19 so arrangements can be made,  as necessary, to prevent transmission to others in the healthcare setting. Next, notify the local health department in your county. If a medical emergency arises and you need to call 911, inform the first responders that you are being tested for the virus that causes COVID-19. Next, notify the local health department in your county. Adhere to all guidance set forth by the [facility] of [employer] for [facility] of patients that is based on guidance from the Center for Disease Control and Prevention with suspected or confirmed COVID-19. It is provided with this guidance for Persons Under Investigation.  Your health and the health of our community are our top priorities. Public Health officials remain available to provide assistance and counseling to you about COVID-19 and compliance with this guidance.  Provider: ____________________________________________________________ Date: ______/_____/_________  By signing below, you acknowledge that you have read and agree to comply with this Guidance for Persons Under Investigation. ______________________________________________________________ Date: ______/_____/_________  WHO DO I CALL? You can find a list of local health departments here: http://dean.org/ Health Department: ____________________________________________________________________ Contact Name: ________________________________________________________________________ Telephone: ___________________________________________________________________________  Nedra Hai, Division of Public Health, Communicable Disease Branch COVID-19 Guidance for Persons Under Investigation September 25, 2018

## 2022-08-21 ENCOUNTER — Ambulatory Visit: Payer: 59 | Admitting: Nurse Practitioner

## 2022-08-21 ENCOUNTER — Encounter: Payer: Self-pay | Admitting: Nurse Practitioner

## 2022-08-21 VITALS — BP 110/70 | HR 74 | Temp 97.6°F | Ht 66.0 in | Wt 160.6 lb

## 2022-08-21 DIAGNOSIS — J018 Other acute sinusitis: Secondary | ICD-10-CM | POA: Diagnosis not present

## 2022-08-21 DIAGNOSIS — R062 Wheezing: Secondary | ICD-10-CM

## 2022-08-21 MED ORDER — HYDROCOD POLI-CHLORPHE POLI ER 10-8 MG/5ML PO SUER
5.0000 mL | Freq: Every evening | ORAL | 0 refills | Status: DC | PRN
Start: 2022-08-21 — End: 2022-09-10

## 2022-08-21 MED ORDER — DOXYCYCLINE HYCLATE 100 MG PO TABS: 100.0000 mg | ORAL_TABLET | Freq: Two times a day (BID) | ORAL | 0 refills | Status: DC

## 2022-08-21 MED ORDER — METHYLPREDNISOLONE 4 MG PO TBPK: ORAL_TABLET | Freq: Every day | ORAL | 0 refills | Status: DC

## 2022-08-21 NOTE — Telephone Encounter (Signed)
Pt sched w/ Charan today [phone]

## 2022-08-21 NOTE — Progress Notes (Signed)
Established Patient Office Visit  Subjective:  Patient ID: Audrey Ortega, female    DOB: 03-31-1976  Age: 47 y.o. MRN: [id number]  CC:  Chief Complaint  Patient presents with   Cough     HPI  Audrey Ortega presents for cough off and on since 3 weeks.   Cough The current episode started 1 to 4 weeks ago. The problem has been gradually worsening. The cough is Productive of sputum. Associated symptoms include headaches, nasal congestion, postnasal drip and wheezing. Pertinent negatives include no ear congestion, ear pain or fever. The symptoms are aggravated by lying down. Risk factors for lung disease include smoking/tobacco exposure. She has tried prescription cough suppressant and OTC cough suppressant for the symptoms.     Past Medical History:  Diagnosis Date   Allergy    Anxiety    Chicken pox    Depression    Emphysema of lung ([place])    Frequent headaches    Hypothyroidism    Migraine     Past Surgical History:  Procedure Laterality Date   NO PAST SURGERIES      Family History  Problem Relation Age of Onset   Arthritis Mother    Hypertension Mother    Diabetes Mother    Arthritis Father    Hypertension Father    Hyperlipidemia Father    Thyroid disease Father        Hypothyroidism   Healthy Daughter    Breast cancer Maternal Aunt 33   Cancer Maternal Aunt        Breast   Alcohol abuse Maternal Grandmother    Diabetes Paternal Grandmother    Hypertension Paternal Grandmother    Diabetes Paternal Grandfather    Alcohol abuse Paternal Grandfather    Hypertension Paternal Grandfather    Heart attack Brother    Hyperlipidemia Brother    Stroke Brother     Social History   Socioeconomic History   Marital status: Legally Separated    Spouse name: Not on file   Number of children: 1   Years of education: Not on file   Highest education level: Not on file  Occupational History   Occupation: Radiation protection practitioner: [employer]   Tobacco Use   Smoking status: Every Day    Packs/day: 1.00    Years: 20.00    Total pack years: 20.00    Types: Cigarettes   Smokeless tobacco: Never  Vaping Use   Vaping Use: Never used  Substance and Sexual Activity   Alcohol use: Yes    Comment: Rare use   Drug use: No   Sexual activity: Yes    Partners: Male  Other Topics Concern   Not on file  Social History Narrative   Not on file   Social Determinants of Health   Financial Resource Strain: Not on file  Food Insecurity: Not on file  Transportation Needs: Not on file  Physical Activity: Not on file  Stress: Not on file  Social Connections: Not on file  Intimate Partner Violence: Not on file     Outpatient Medications Prior to Visit  Medication Sig Dispense Refill   albuterol (VENTOLIN HFA) 108 (90 Base) MCG/ACT inhaler Inhale 1-2 puffs into the lungs every 6 (six) hours as needed for wheezing or shortness of breath. 18 g 6   benzonatate (TESSALON) 200 MG capsule Take 1 capsule (200 mg total) by mouth 2 (two) times daily as needed for cough. 20 capsule 0  ipratropium (ATROVENT) 0.03 % nasal spray Place 2 sprays into both nostrils every 12 (twelve) hours. 30 mL 0   levothyroxine (SYNTHROID) 88 MCG tablet Take 88 mcg by mouth daily before breakfast.     loratadine (CLARITIN) 10 MG tablet Take 10 mg by mouth daily.     magnesium oxide (MAG-OX) 400 (240 Mg) MG tablet TAKE 1 TABLET BY MOUTH EVERY DAY 90 tablet 0   benzonatate (TESSALON PERLES) 100 MG capsule Take 1 capsule (100 mg total) by mouth 3 (three) times daily as needed for cough. 21 capsule 0   busPIRone (BUSPAR) 5 MG tablet TAKE 1 TABLET BY MOUTH TWICE A DAY (Patient not taking: Reported on 08/21/2022) 180 tablet 1   predniSONE (DELTASONE) 10 MG tablet Take 6 tablets x 1 day and then decrease by 1/2 tablet per day until down to zero mg. 39 tablet 0   No facility-administered medications prior to visit.    Allergies  Allergen Reactions   Amoxicillin-Pot  Clavulanate Hives and Rash    ROS Review of Systems  Constitutional:  Negative for fever.  HENT:  Positive for postnasal drip. Negative for ear pain.   Eyes: Negative.   Respiratory:  Positive for cough and wheezing.   Cardiovascular: Negative.        Chest tightness with constant coughing   Neurological:  Positive for headaches.  Psychiatric/Behavioral: Negative.        Objective:    Physical Exam Constitutional:      Appearance: Normal appearance.  HENT:     Right Ear: Tympanic membrane normal.     Left Ear: Tympanic membrane normal.     Nose:     Right Sinus: Maxillary sinus tenderness and frontal sinus tenderness present.     Left Sinus: Maxillary sinus tenderness and frontal sinus tenderness present.  Cardiovascular:     Pulses: Normal pulses.     Heart sounds: Normal heart sounds.  Pulmonary:     Breath sounds: Wheezing present.  Neurological:     Mental Status: She is alert.     BP 110/70   Pulse 74   Temp 97.6 F (36.4 C) (Oral)   Ht 5\' 6"  (1.676 m)   Wt 160 lb 9.6 oz (72.8 kg)   LMP  (LMP Unknown)   SpO2 98%   BMI 25.92 kg/m  Wt Readings from Last 3 Encounters:  08/21/22 160 lb 9.6 oz (72.8 kg)  06/05/22 151 lb 4.8 oz (68.6 kg)  05/09/22 151 lb 8 oz (68.7 kg)     Health Maintenance  Topic Date Due   DTaP/Tdap/Td (2 - Td or Tdap) 07/21/2020   COVID-19 Vaccine (3 - 2023-24 season) 03/21/2022   INFLUENZA VACCINE  10/19/2022 (Originally 02/18/2022)   COLONOSCOPY (Pts 45-72yrs Insurance coverage will need to be confirmed)  12/03/2022 (Originally 12/27/2019)   Hepatitis C Screening  12/03/2022 (Originally 12/26/1992)   HIV Screening  12/03/2022 (Originally 12/26/1989)   MAMMOGRAM  04/12/2023   PAP SMEAR-Modifier  03/21/2024   HPV VACCINES  Aged Out    There are no preventive care reminders to display for this patient.  Lab Results  Component Value Date   TSH 3.87 10/22/2021   Lab Results  Component Value Date   WBC 8.0 10/22/2021   HGB 13.1  10/22/2021   HCT 39.1 10/22/2021   MCV 92.9 10/22/2021   PLT 302.0 10/22/2021   Lab Results  Component Value Date   NA 137 10/22/2021   K 4.3 10/22/2021   CO2 29  10/22/2021   GLUCOSE 82 10/22/2021   BUN 6 10/22/2021   CREATININE 0.74 10/22/2021   BILITOT 0.6 10/22/2021   ALKPHOS 43 10/22/2021   AST 13 10/22/2021   ALT 8 10/22/2021   PROT 6.6 10/22/2021   ALBUMIN 4.1 10/22/2021   CALCIUM 9.0 10/22/2021   ANIONGAP 7 01/04/2021   GFR 96.75 10/22/2021   Lab Results  Component Value Date   CHOL 155 10/22/2021   Lab Results  Component Value Date   HDL 46.70 10/22/2021   Lab Results  Component Value Date   LDLCALC 98 10/22/2021   Lab Results  Component Value Date   TRIG 52.0 10/22/2021   Lab Results  Component Value Date   CHOLHDL 3 10/22/2021   No results found for: "HGBA1C"    Assessment & Plan:   Problem List Items Addressed This Visit       Respiratory   Acute infection of nasal sinus - Primary    Started her on doxycycline 100 mg twice a day for 10 days, medrol dose pack and tussionex. Encouraged to increase hydration, use steam and humidifier. If sx. do not improves call the office for further evaluation.       Relevant Medications   chlorpheniramine-HYDROcodone (TUSSIONEX) 10-8 MG/5ML   methylPREDNISolone (MEDROL DOSEPAK) 4 MG TBPK tablet   doxycycline (VIBRA-TABS) 100 MG tablet     Meds ordered this encounter  Medications   chlorpheniramine-HYDROcodone (TUSSIONEX) 10-8 MG/5ML    Sig: Take 5 mLs by mouth at bedtime as needed for cough.    Dispense:  70 mL    Refill:  0   methylPREDNISolone (MEDROL DOSEPAK) 4 MG TBPK tablet    Sig: Take by mouth daily.    Dispense:  21 each    Refill:  0   doxycycline (VIBRA-TABS) 100 MG tablet    Sig: Take 1 tablet (100 mg total) by mouth 2 (two) times daily.    Dispense:  14 tablet    Refill:  0     Follow-up: Return if symptoms worsen or fail to improve.    Theresia Lo, NP

## 2022-08-21 NOTE — Patient Instructions (Signed)
Started on doxycycline, Medrol Dosepak and Tussionex. Advised to increase fluid intake. Please call the office if symptoms did not improve.

## 2022-08-23 ENCOUNTER — Encounter: Payer: Self-pay | Admitting: Nurse Practitioner

## 2022-08-23 DIAGNOSIS — J019 Acute sinusitis, unspecified: Secondary | ICD-10-CM | POA: Insufficient documentation

## 2022-08-23 NOTE — Assessment & Plan Note (Signed)
Started her on doxycycline 100 mg twice a day for 10 days, medrol dose pack and tussionex. Encouraged to increase hydration, use steam and humidifier. If sx. do not improves call the office for further evaluation.

## 2022-08-25 NOTE — Telephone Encounter (Signed)
am ok to send in more tessalon perles if unable to get the other cough medication. Please CALL her and see how she is dong and get specific symptoms.  If she needs or wants me to see her I can add her on at the end of today - virtual visit - if she desires.  Just let me know what she needs.

## 2022-08-25 NOTE — Telephone Encounter (Signed)
Cheratussin - have short supply at [facility] or can use delsym and tessalon perles.

## 2022-08-25 NOTE — Telephone Encounter (Signed)
FYI - Patient is going to try the delsym and tessalon perles tonight and let us know if this works.

## 2022-08-25 NOTE — Telephone Encounter (Signed)
Pt saw Charan 2/1 for cough/sinusitis. Was prescribed Tussionex. Per pharmacy - cannot get, on back order.  They have Tessalon pearles, or the promethazine DM. Pt would like alternative sent into pharmacy.  Please advise, can send into pharmacy for you.

## 2022-08-25 NOTE — Telephone Encounter (Signed)
Patient is coughing more at night and in the morning. Not so much through out the day unless she is doing something and gets hot then she will cough some. Denies any other symptoms. She has some tessalon perles but she doesn't really feel like they help. Patient stated that she is improving but needs some type of cough medication to help her at night time. She does not feel like she needs to be seen. Can we send in an alternative to the tussionex? It looks like the alternative in stock is promethazine DM.

## 2022-08-26 ENCOUNTER — Other Ambulatory Visit: Payer: Self-pay | Admitting: Family

## 2022-09-10 ENCOUNTER — Ambulatory Visit: Payer: 59 | Admitting: Internal Medicine

## 2022-09-10 ENCOUNTER — Encounter: Payer: Self-pay | Admitting: Internal Medicine

## 2022-09-10 VITALS — BP 106/68 | HR 84 | Temp 97.9°F | Resp 16 | Ht 66.0 in | Wt 163.2 lb

## 2022-09-10 DIAGNOSIS — R053 Chronic cough: Secondary | ICD-10-CM

## 2022-09-10 DIAGNOSIS — R928 Other abnormal and inconclusive findings on diagnostic imaging of breast: Secondary | ICD-10-CM | POA: Diagnosis not present

## 2022-09-10 DIAGNOSIS — E039 Hypothyroidism, unspecified: Secondary | ICD-10-CM

## 2022-09-10 DIAGNOSIS — R062 Wheezing: Secondary | ICD-10-CM

## 2022-09-10 DIAGNOSIS — F419 Anxiety disorder, unspecified: Secondary | ICD-10-CM | POA: Diagnosis not present

## 2022-09-10 DIAGNOSIS — F32A Depression, unspecified: Secondary | ICD-10-CM

## 2022-09-10 MED ORDER — PREDNISONE 10 MG PO TABS: ORAL_TABLET | ORAL | 0 refills | Status: DC

## 2022-09-10 MED ORDER — PANTOPRAZOLE SODIUM 40 MG PO TBEC: 40.0000 mg | DELAYED_RELEASE_TABLET | Freq: Every day | ORAL | 2 refills | Status: DC

## 2022-09-10 NOTE — Progress Notes (Signed)
Subjective:    Patient ID: Audrey Ortega, female    DOB: 02-Dec-1975, 47 y.o.   MRN: [id number]  Patient here for  Chief Complaint  Patient presents with   Medical Management of Chronic Issues    HPI Here to follow up regarding increased stress and hypothyroidism. Has Buspar.  Also taking magnesium - headaches.  Saw gyn 03/25/22.  Last pap 03/21/21 - NILM, hrHPV negative. Sees Dr Honor Junes for hypothyroidism and vitamin D deficiency.  On synthroid.  Last seen 05/15/22.  Diagnosed with covid - 07/19/23.  Evaluated 08/21/22 - persistent cough.  Diagnosed with sinus infection/resp infection.  Treated with doxycycline and medrol dosepak and tussionex. Reports was previously on symbicort.  Does not have rx currently.  Still issues with cough.  Wakes up hot.  Some increased cough with some wheezing.  Some mucus in throat.  No fever.  Has albuterol - taking prn. No increased sob.  Has noticed some acid reflux.  Discussed possibly aggravating above.  No abdominal pain.  Concerned regarding weight gain.  Discussed quitting smoking.  Has cut down - with above issues. Had f/u mammogram 03/2022 - recommended f/u bilateral diagnostic mammogram in one year.     Past Medical History:  Diagnosis Date   Allergy    Anxiety    Chicken pox    Depression    Emphysema of lung ([place])    Frequent headaches    Hypothyroidism    Migraine    Past Surgical History:  Procedure Laterality Date   NO PAST SURGERIES     Family History  Problem Relation Age of Onset   Arthritis Mother    Hypertension Mother    Diabetes Mother    Arthritis Father    Hypertension Father    Hyperlipidemia Father    Thyroid disease Father        Hypothyroidism   Healthy Daughter    Breast cancer Maternal Aunt 19   Cancer Maternal Aunt        Breast   Alcohol abuse Maternal Grandmother    Diabetes Paternal Grandmother    Hypertension Paternal Grandmother    Diabetes Paternal Grandfather    Alcohol abuse Paternal Grandfather     Hypertension Paternal Grandfather    Heart attack Brother    Hyperlipidemia Brother    Stroke Brother    Social History   Socioeconomic History   Marital status: Legally Separated    Spouse name: Not on file   Number of children: 1   Years of education: Not on file   Highest education level: Not on file  Occupational History   Occupation: Radiation protection practitioner: [employer]  Tobacco Use   Smoking status: Every Day    Packs/day: 1.00    Years: 20.00    Total pack years: 20.00    Types: Cigarettes   Smokeless tobacco: Never  Vaping Use   Vaping Use: Never used  Substance and Sexual Activity   Alcohol use: Yes    Comment: Rare use   Drug use: No   Sexual activity: Yes    Partners: Male  Other Topics Concern   Not on file  Social History Narrative   Not on file   Social Determinants of Health   Financial Resource Strain: Not on file  Food Insecurity: Not on file  Transportation Needs: Not on file  Physical Activity: Not on file  Stress: Not on file  Social Connections: Not on file  Review of Systems  Constitutional:  Negative for appetite change.       Concern regarding weight gain.   HENT:  Negative for congestion and sinus pressure.   Respiratory:  Positive for cough and wheezing. Negative for chest tightness and shortness of breath.   Cardiovascular:  Negative for chest pain and palpitations.  Gastrointestinal:  Negative for abdominal pain, diarrhea, nausea and vomiting.       Acid reflux.   Genitourinary:  Negative for difficulty urinating and dysuria.  Musculoskeletal:  Negative for joint swelling and myalgias.  Skin:  Negative for color change and rash.  Neurological:  Negative for dizziness and headaches.  Psychiatric/Behavioral:  Negative for agitation and dysphoric mood.        Objective:     BP 106/68   Pulse 84   Temp 97.9 F (36.6 C)   Resp 16   Ht '5\' 6"'$  (1.676 m)   Wt 163 lb 3.2 oz (74 kg)   LMP  (LMP Unknown)    SpO2 98%   BMI 26.34 kg/m  Wt Readings from Last 3 Encounters:  09/10/22 163 lb 3.2 oz (74 kg)  08/21/22 160 lb 9.6 oz (72.8 kg)  06/05/22 151 lb 4.8 oz (68.6 kg)    Physical Exam Vitals reviewed.  Constitutional:      General: She is not in acute distress.    Appearance: Normal appearance.  HENT:     Head: Normocephalic and atraumatic.     Right Ear: External ear normal.     Left Ear: External ear normal.  Eyes:     General: No scleral icterus.       Right eye: No discharge.        Left eye: No discharge.     Conjunctiva/sclera: Conjunctivae normal.  Neck:     Thyroid: No thyromegaly.  Cardiovascular:     Rate and Rhythm: Normal rate and regular rhythm.  Pulmonary:     Effort: No respiratory distress.     Breath sounds: Normal breath sounds. No wheezing.     Comments: Some increased cough - talking/forced expiration.  Abdominal:     General: Bowel sounds are normal.     Palpations: Abdomen is soft.     Tenderness: There is no abdominal tenderness.  Musculoskeletal:        General: No swelling or tenderness.     Cervical back: Neck supple. No tenderness.  Lymphadenopathy:     Cervical: No cervical adenopathy.  Skin:    Findings: No erythema or rash.  Neurological:     Mental Status: She is alert.  Psychiatric:        Mood and Affect: Mood normal.        Behavior: Behavior normal.      Outpatient Encounter Medications as of 09/10/2022  Medication Sig   pantoprazole (PROTONIX) 40 MG tablet Take 1 tablet (40 mg total) by mouth daily.   predniSONE (DELTASONE) 10 MG tablet Take 6 tablets x 1 day and then decrease by 1/2 tablet per day until down to zero mg.   albuterol (VENTOLIN HFA) 108 (90 Base) MCG/ACT inhaler INHALE 1-2 PUFFS BY MOUTH EVERY 6 HOURS AS NEEDED FOR WHEEZE OR SHORTNESS OF BREATH   benzonatate (TESSALON) 200 MG capsule Take 1 capsule (200 mg total) by mouth 2 (two) times daily as needed for cough.   ipratropium (ATROVENT) 0.03 % nasal spray Place 2  sprays into both nostrils every 12 (twelve) hours.   levothyroxine (SYNTHROID) 88 MCG tablet  Take 88 mcg by mouth daily before breakfast.   loratadine (CLARITIN) 10 MG tablet Take 10 mg by mouth daily.   magnesium oxide (MAG-OX) 400 (240 Mg) MG tablet TAKE 1 TABLET BY MOUTH EVERY DAY   [DISCONTINUED] chlorpheniramine-HYDROcodone (TUSSIONEX) 10-8 MG/5ML Take 5 mLs by mouth at bedtime as needed for cough.   [DISCONTINUED] doxycycline (VIBRA-TABS) 100 MG tablet Take 1 tablet (100 mg total) by mouth 2 (two) times daily.   [DISCONTINUED] methylPREDNISolone (MEDROL DOSEPAK) 4 MG TBPK tablet Take by mouth daily.   No facility-administered encounter medications on file as of 09/10/2022.     Lab Results  Component Value Date   WBC 8.0 10/22/2021   HGB 13.1 10/22/2021   HCT 39.1 10/22/2021   PLT 302.0 10/22/2021   GLUCOSE 82 10/22/2021   CHOL 155 10/22/2021   TRIG 52.0 10/22/2021   HDL 46.70 10/22/2021   LDLCALC 98 10/22/2021   ALT 8 10/22/2021   AST 13 10/22/2021   NA 137 10/22/2021   K 4.3 10/22/2021   CL 104 10/22/2021   CREATININE 0.74 10/22/2021   BUN 6 10/22/2021   CO2 29 10/22/2021   TSH 3.87 10/22/2021    MM DIAG BREAST TOMO BILATERAL  Result Date: 04/11/2022 CLINICAL DATA:  BI-RADS 3 follow-up of 2 LEFT breast masses, initiated September 2022. Retroareolar LEFT nipple pain 3 months ago. Improving. EXAM: DIGITAL DIAGNOSTIC BILATERAL MAMMOGRAM WITH TOMOSYNTHESIS; ULTRASOUND LEFT BREAST LIMITED TECHNIQUE: Bilateral digital diagnostic mammography and breast tomosynthesis was performed.; Targeted ultrasound examination of the left breast was performed. COMPARISON:  Previous exam(s). ACR Breast Density Category c: The breast tissue is heterogeneously dense, which may obscure small masses. FINDINGS: Diagnostic images LEFT breast min straight mammographic stability of 2 oval circumscribed masses in the LEFT outer breast at posterior depth. No new suspicious findings are noted. No new  suspicious findings are noted in the region of painful concern in the LEFT retroareolar breast. No suspicious mass, distortion, or microcalcifications are identified to suggest presence of malignancy in the RIGHT breast. Targeted ultrasound was performed of the LEFT breast. At 3 o'clock 8 cm from the nipple (labeled as 3 o'clock 5 cm from the nipple for consistency purposes), there is revisualization of an oval circumscribed mass with suggestion of internal echogenic hilum. It measures 7 by 2 by 6 mm, previously 7 x 2 x 9 mm. This may reflect a benign cluster of cysts or benign intramammary lymph node. At 2:30 8 cm from the nipple, there is an oval circumscribed anechoic mass with a thin internal septation internal septation. This measures 5 x 3 x 4 mm and is consistent with a benign cluster of cysts Targeted ultrasound was performed of the LEFT retroareolar breast. No suspicious cystic or solid mass is seen at the site of painful concern. IMPRESSION: 1. Stable probably benign LEFT breast mass at 3 o'clock from the nipple which likely reflects a mildly complicated cyst versus intramammary lymph node. Recommend follow-up diagnostic mammogram and ultrasound in 1 year. This will establish over 2 years of stability. 2. There is a benign cluster of cysts in the LEFT breast at 2:30 8 cm from the nipple. 3. No mammographic or sonographic evidence of malignancy at the site of painful concern in the LEFT retroareolar breast. Any further workup of the patient's symptoms should be based on the clinical assessment. 4. No mammographic evidence of malignancy in the RIGHT breast. RECOMMENDATION: Bilateral diagnostic mammogram with LEFT breast ultrasound (RIGHT breast ultrasound as deemed necessary) in 1 year.  I have discussed the findings and recommendations with the patient. If applicable, a reminder letter will be sent to the patient regarding the next appointment. BI-RADS CATEGORY  3: Probably benign. Electronically Signed    By: Valentino Saxon M.D.   On: 04/11/2022 10:10  US BREAST LTD UNI LEFT INC AXILLA  Result Date: 04/11/2022 CLINICAL DATA:  BI-RADS 3 follow-up of 2 LEFT breast masses, initiated September 2022. Retroareolar LEFT nipple pain 3 months ago. Improving. EXAM: DIGITAL DIAGNOSTIC BILATERAL MAMMOGRAM WITH TOMOSYNTHESIS; ULTRASOUND LEFT BREAST LIMITED TECHNIQUE: Bilateral digital diagnostic mammography and breast tomosynthesis was performed.; Targeted ultrasound examination of the left breast was performed. COMPARISON:  Previous exam(s). ACR Breast Density Category c: The breast tissue is heterogeneously dense, which may obscure small masses. FINDINGS: Diagnostic images LEFT breast min straight mammographic stability of 2 oval circumscribed masses in the LEFT outer breast at posterior depth. No new suspicious findings are noted. No new suspicious findings are noted in the region of painful concern in the LEFT retroareolar breast. No suspicious mass, distortion, or microcalcifications are identified to suggest presence of malignancy in the RIGHT breast. Targeted ultrasound was performed of the LEFT breast. At 3 o'clock 8 cm from the nipple (labeled as 3 o'clock 5 cm from the nipple for consistency purposes), there is revisualization of an oval circumscribed mass with suggestion of internal echogenic hilum. It measures 7 by 2 by 6 mm, previously 7 x 2 x 9 mm. This may reflect a benign cluster of cysts or benign intramammary lymph node. At 2:30 8 cm from the nipple, there is an oval circumscribed anechoic mass with a thin internal septation internal septation. This measures 5 x 3 x 4 mm and is consistent with a benign cluster of cysts Targeted ultrasound was performed of the LEFT retroareolar breast. No suspicious cystic or solid mass is seen at the site of painful concern. IMPRESSION: 1. Stable probably benign LEFT breast mass at 3 o'clock from the nipple which likely reflects a mildly complicated cyst versus  intramammary lymph node. Recommend follow-up diagnostic mammogram and ultrasound in 1 year. This will establish over 2 years of stability. 2. There is a benign cluster of cysts in the LEFT breast at 2:30 8 cm from the nipple. 3. No mammographic or sonographic evidence of malignancy at the site of painful concern in the LEFT retroareolar breast. Any further workup of the patient's symptoms should be based on the clinical assessment. 4. No mammographic evidence of malignancy in the RIGHT breast. RECOMMENDATION: Bilateral diagnostic mammogram with LEFT breast ultrasound (RIGHT breast ultrasound as deemed necessary) in 1 year. I have discussed the findings and recommendations with the patient. If applicable, a reminder letter will be sent to the patient regarding the next appointment. BI-RADS CATEGORY  3: Probably benign. Electronically Signed   By: Valentino Saxon M.D.   On: 04/11/2022 10:10      Assessment & Plan:  Chronic cough Assessment & Plan: Has had intermittent issues for a while. Previously seen in 05/2022 with persistent cough.  Discussed recent treatments and persistent symptoms. CXR 05/2022 - no active cardiopulmonary disease.  Discussed stopping smoking.  Given current symptoms, prednisone taper as directed.  Treat acid reflux - start protonix daily.  Start claritin daily and nasacort daily.  Discussed referral to pulmonary.  Agreeable.  Continue albuterol inhaler prn.    Orders: -     Ambulatory referral to Pulmonology  Wheezing -     Ambulatory referral to Pulmonology  Abnormal mammogram Assessment &  Plan: Had f/u mammogram 03/2022 - recommended f/u bilateral diagnostic mammogram in one year.     Anxiety and depression Assessment & Plan: Has buspar if needed. Follow.    Hypothyroidism, unspecified type Assessment & Plan: On synthroid.  Follow tsh.    Other orders -     predniSONE; Take 6 tablets x 1 day and then decrease by 1/2 tablet per day until down to zero mg.   Dispense: 39 tablet; Refill: 0 -     Pantoprazole Sodium; Take 1 tablet (40 mg total) by mouth daily.  Dispense: 30 tablet; Refill: 2     Einar Pheasant, MD

## 2022-09-10 NOTE — Patient Instructions (Signed)
Start antihistamine (claritin or zyrtec) - one per day  Nasacort nasal spray - 2 sprays each nostril one time per day.  Do this in the evening.    Take the protonix 30 minutes before breakfast.

## 2022-09-12 IMAGING — MG MM DIGITAL DIAGNOSTIC UNILAT*L* W/ TOMO W/ CAD
4 series · 4 of 12 positions shown · non-contrast
Comparison: Previous exam(s).

CLINICAL DATA: BI-RADS 3 follow-up of the LEFT breast mass,
initiated April 2021

EXAM:
DIGITAL DIAGNOSTIC UNILATERAL LEFT MAMMOGRAM WITH TOMOSYNTHESIS AND
CAD; ULTRASOUND LEFT BREAST LIMITED
TECHNIQUE: Left digital diagnostic mammography and breast tomosynthesis was
performed. The images were evaluated with computer-aided detection.;
Targeted ultrasound examination of the left breast was performed.

[L MLO synth-2D]
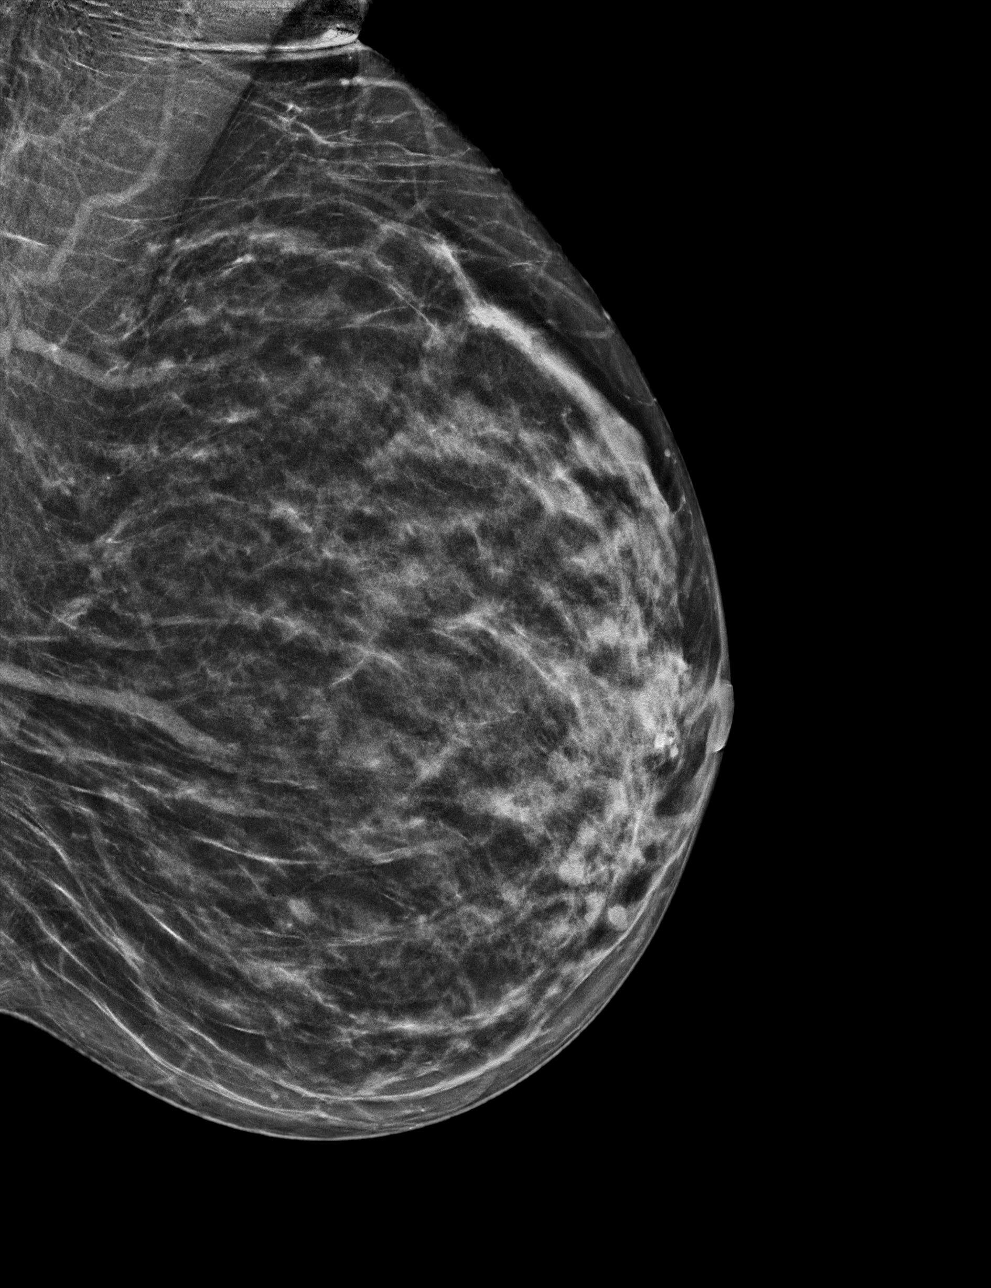

[L CC synth-2D]
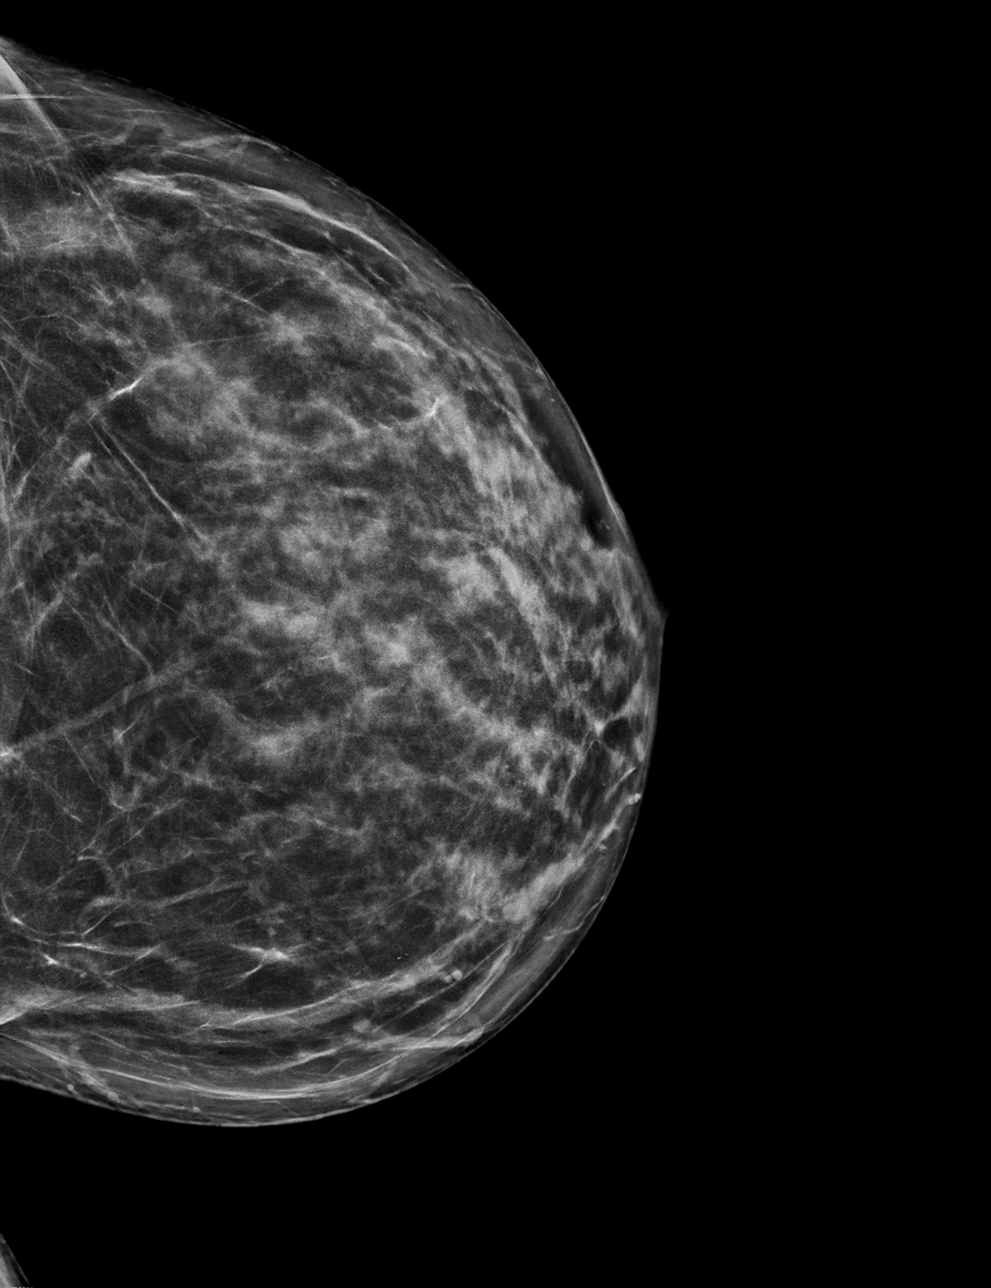

[L CC tomo · tomo slice 33/64.0]
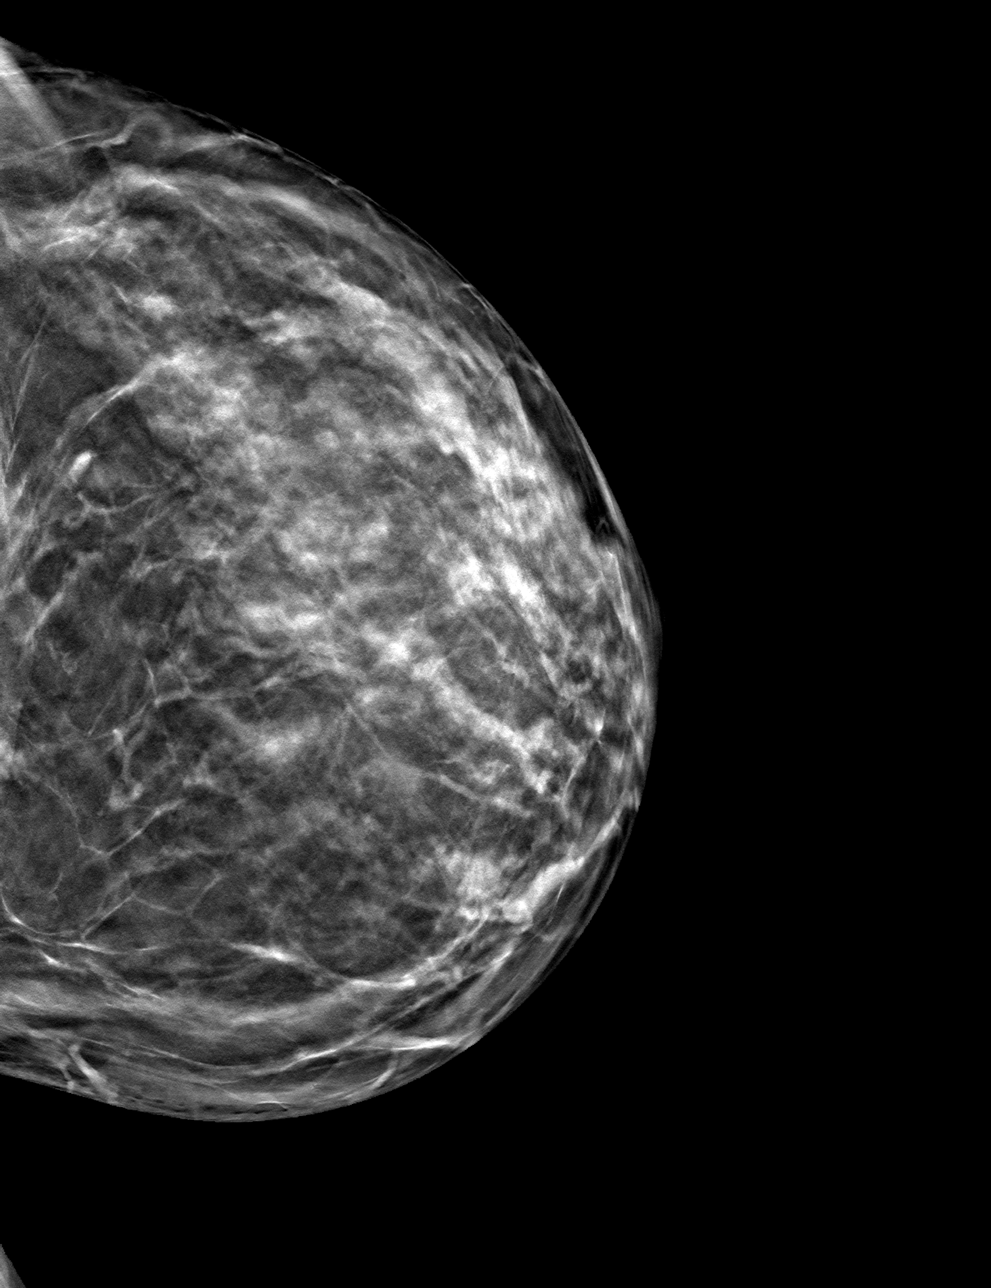

[L MLO tomo · tomo slice 27/54.0]
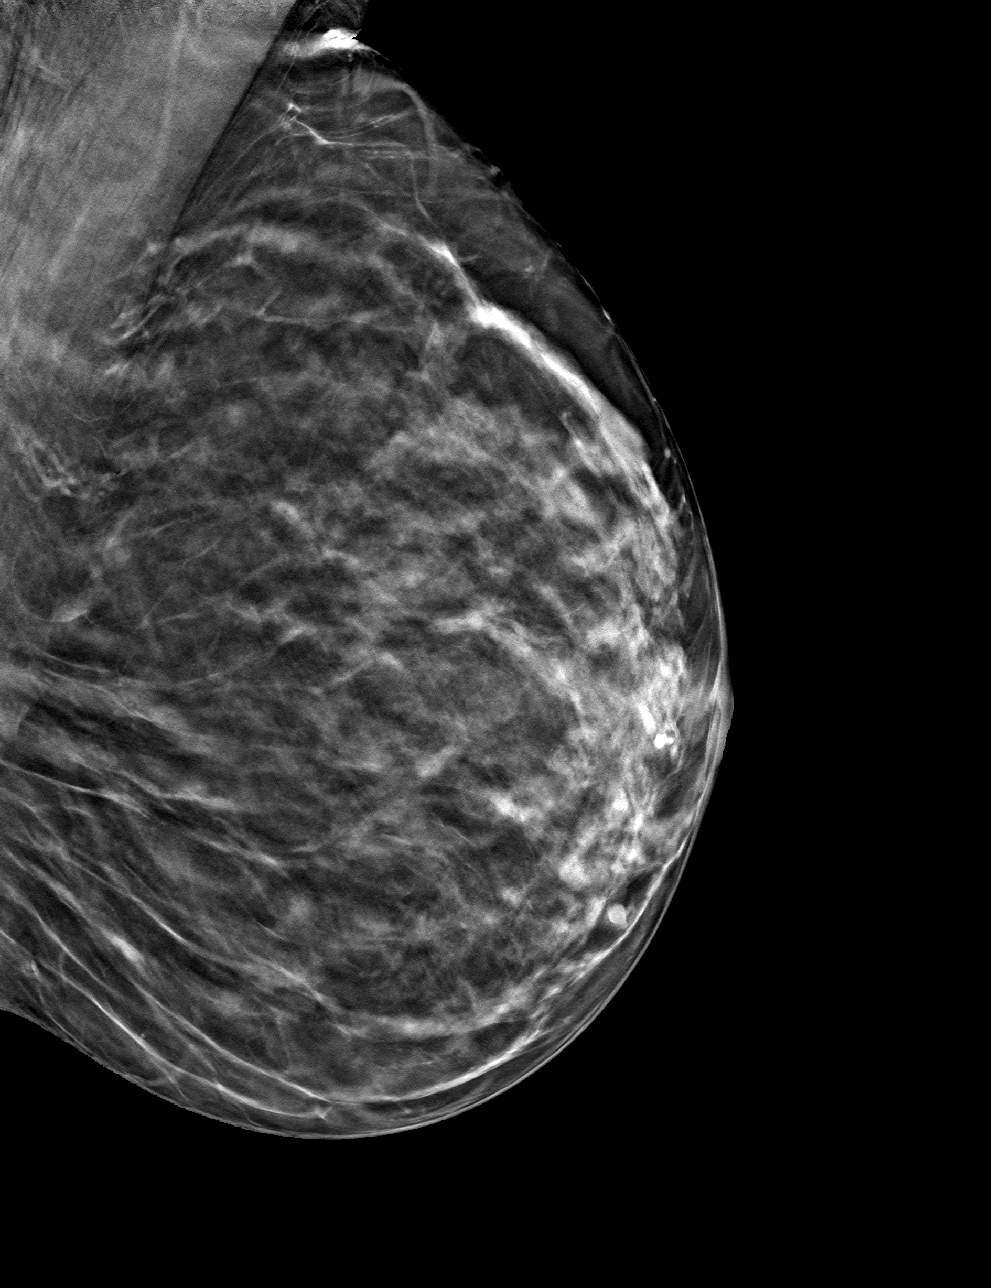

[4 of 12 positions shown; findings below may reference images not displayed]

ACR Breast Density Category c: The breast tissue is heterogeneously
dense, which may obscure small masses.
FINDINGS: Diagnostic images of the LEFT breast demonstrate decreased
conspicuity of previously described oval circumscribed mass in the
LEFT outer breast at posterior depth. No new suspicious findings are
noted in the LEFT breast.

Targeted ultrasound was performed of the LEFT outer breast. At 3
o'clock 8 cm from the nipple (labeled 5 cm from nipple for
consistency), there is an oval circumscribed hypoechoic mass with
suggestion of an internal echogenic hilum. This measures 8 by 6 x 2
mm, previously 9 by 7 x 2 mm. There is a similar appearing
additional oval circumscribed hypoechoic mass which measures 4 x 2 x
6 mm at [DATE] 8 cm from the nipple. These may reflect internal
mammary lymph nodes.
IMPRESSION: 1. Decreased conspicuity of a probably benign LEFT breast mass
mammographically. Stable sonographic appearance of a possible
intramammary lymph node at 3 o'clock 8 cm from the nipple (labeled 5
cm from the nipple for consistency purposes). Recommend follow-up
mammogram and ultrasound in 6 months. This will establish 1 year of
definitive stability from baseline mammogram.

RECOMMENDATION:
Bilateral diagnostic mammogram with LEFT breast ultrasound (RIGHT
breast ultrasound if deemed necessary) in 6 months. Patient is due
for contralateral screening at this point in time.

I have discussed the findings and recommendations with the patient.
If applicable, a reminder letter will be sent to the patient
regarding the next appointment.

BI-RADS CATEGORY  3: Probably benign.

## 2022-09-12 IMAGING — US US BREAST*L* LIMITED INC AXILLA
1 series · 10 of 10 positions shown · non-contrast
Comparison: Previous exam(s).

CLINICAL DATA: BI-RADS 3 follow-up of the LEFT breast mass,
initiated April 2021

EXAM:
DIGITAL DIAGNOSTIC UNILATERAL LEFT MAMMOGRAM WITH TOMOSYNTHESIS AND
CAD; ULTRASOUND LEFT BREAST LIMITED
TECHNIQUE: Left digital diagnostic mammography and breast tomosynthesis was
performed. The images were evaluated with computer-aided detection.;
Targeted ultrasound examination of the left breast was performed.

[Series 1: us breast*left* limited inc axilla · 0.03mm/px · 10 of 10 slices shown]
[im 1/10]
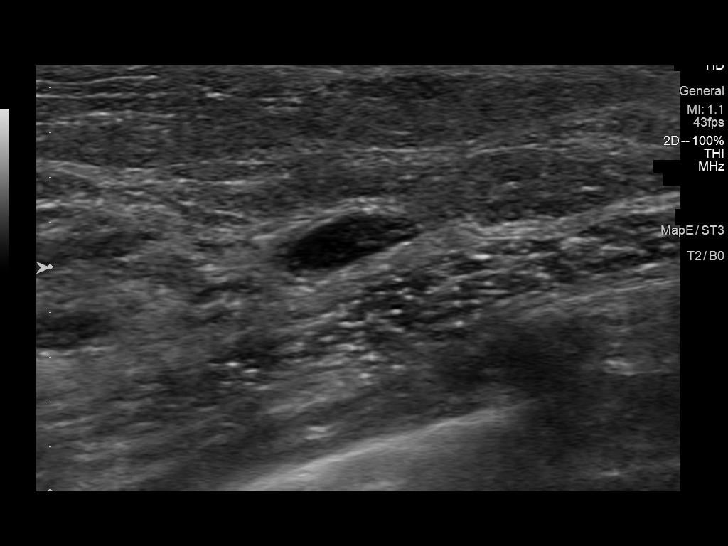
[im 2/10]
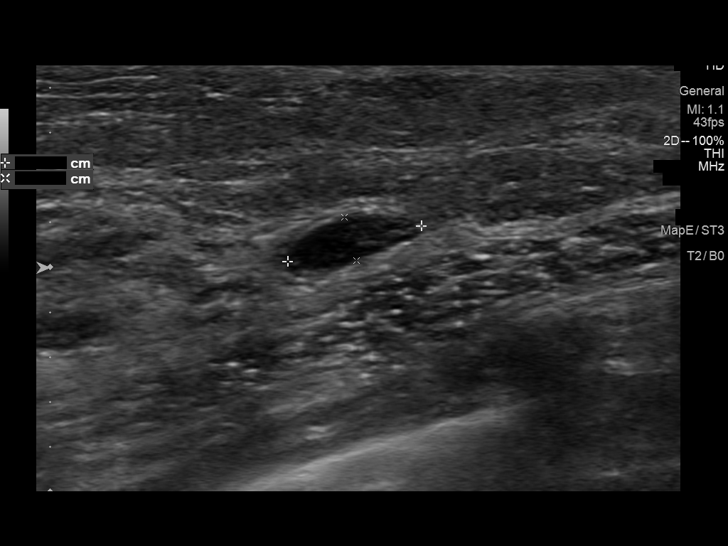
[im 3/10]
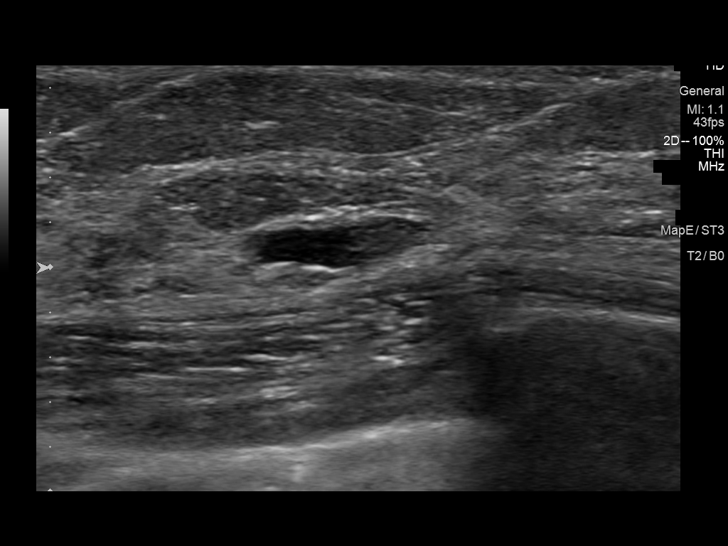
[im 4/10]
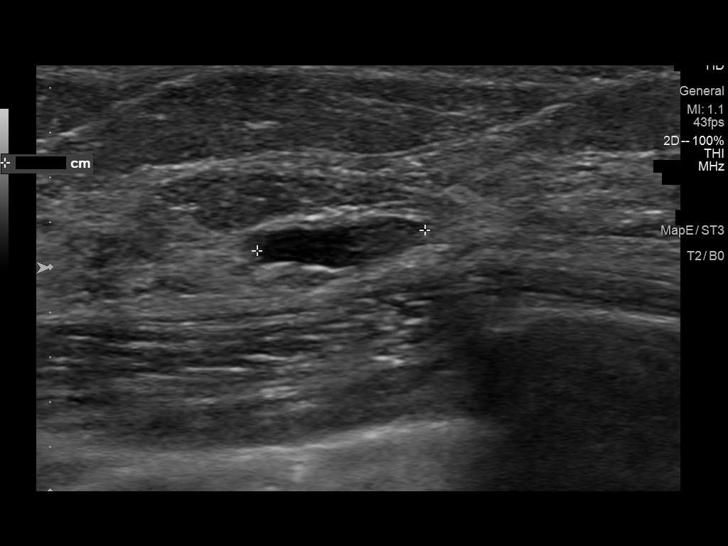
[im 5/10]
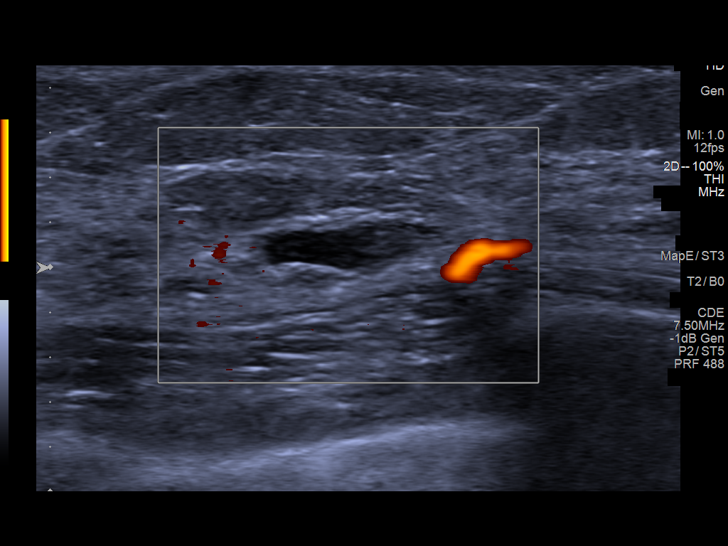
[im 6/10]
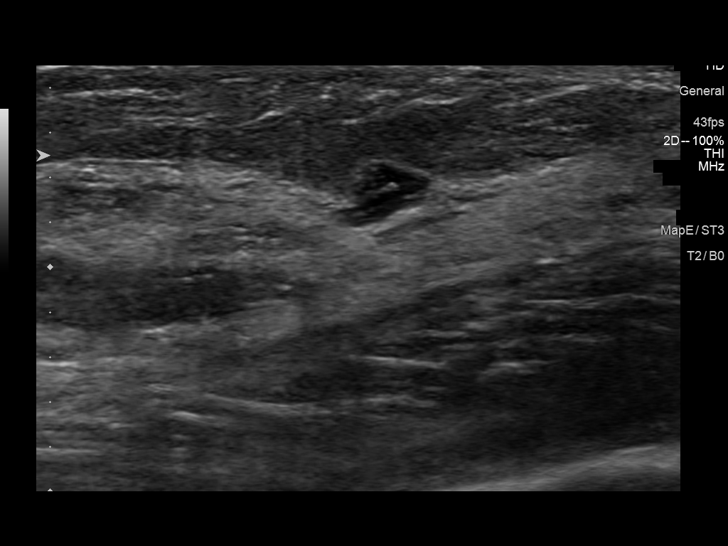
[im 7/10]
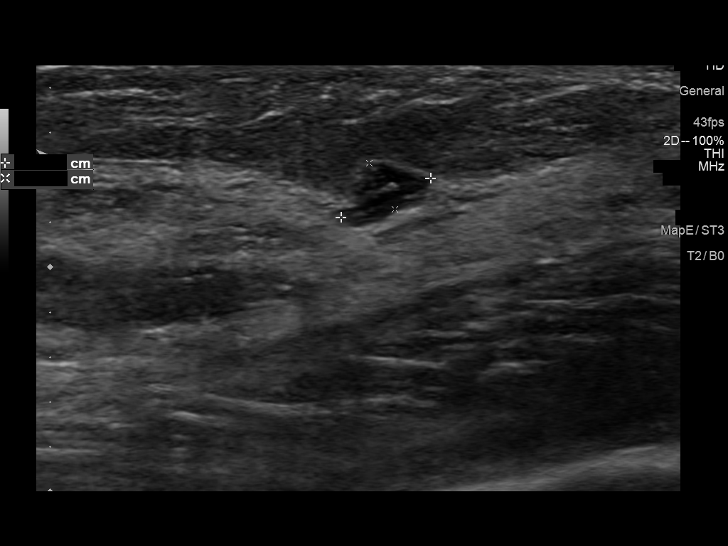
[im 8/10]
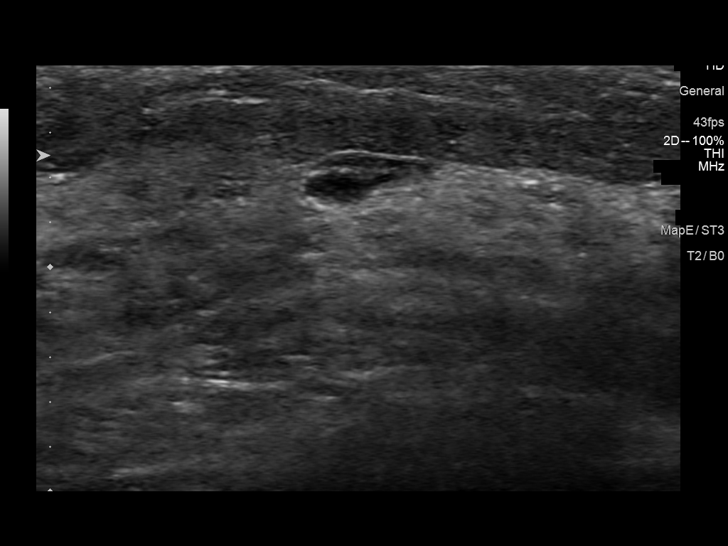
[im 9/10]
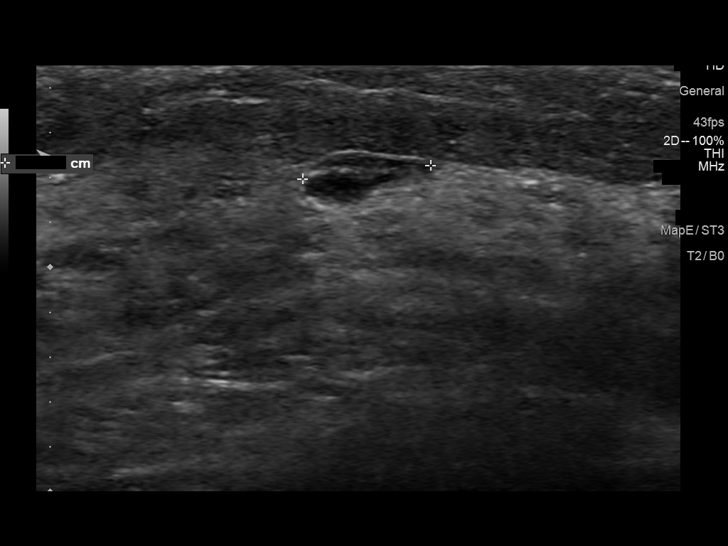
[im 10/10]
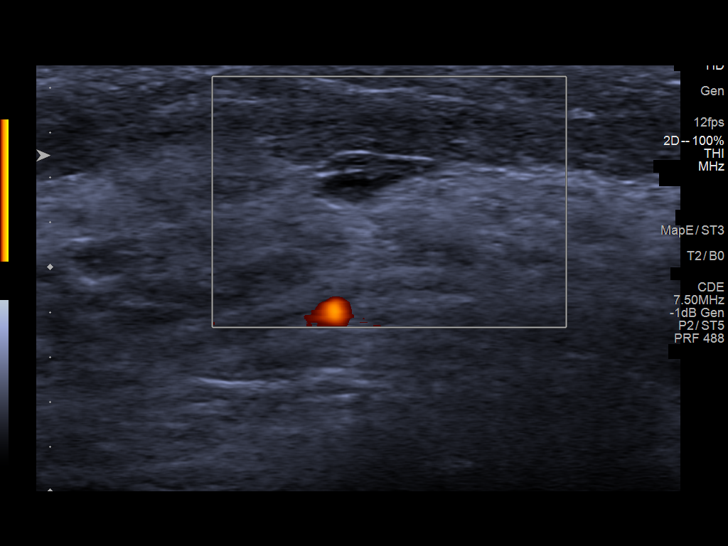

[10 of 10 positions shown; findings below may reference images not displayed]

ACR Breast Density Category c: The breast tissue is heterogeneously
dense, which may obscure small masses.
FINDINGS: Diagnostic images of the LEFT breast demonstrate decreased
conspicuity of previously described oval circumscribed mass in the
LEFT outer breast at posterior depth. No new suspicious findings are
noted in the LEFT breast.

Targeted ultrasound was performed of the LEFT outer breast. At 3
o'clock 8 cm from the nipple (labeled 5 cm from nipple for
consistency), there is an oval circumscribed hypoechoic mass with
suggestion of an internal echogenic hilum. This measures 8 by 6 x 2
mm, previously 9 by 7 x 2 mm. There is a similar appearing
additional oval circumscribed hypoechoic mass which measures 4 x 2 x
6 mm at [DATE] 8 cm from the nipple. These may reflect internal
mammary lymph nodes.
IMPRESSION: 1. Decreased conspicuity of a probably benign LEFT breast mass
mammographically. Stable sonographic appearance of a possible
intramammary lymph node at 3 o'clock 8 cm from the nipple (labeled 5
cm from the nipple for consistency purposes). Recommend follow-up
mammogram and ultrasound in 6 months. This will establish 1 year of
definitive stability from baseline mammogram.

RECOMMENDATION:
Bilateral diagnostic mammogram with LEFT breast ultrasound (RIGHT
breast ultrasound if deemed necessary) in 6 months. Patient is due
for contralateral screening at this point in time.

I have discussed the findings and recommendations with the patient.
If applicable, a reminder letter will be sent to the patient
regarding the next appointment.

BI-RADS CATEGORY  3: Probably benign.

## 2022-09-21 ENCOUNTER — Encounter: Payer: Self-pay | Admitting: Internal Medicine

## 2022-09-21 NOTE — Assessment & Plan Note (Addendum)
Has had intermittent issues for a while. Previously seen in 05/2022 with persistent cough.  Discussed recent treatments and persistent symptoms. CXR 05/2022 - no active cardiopulmonary disease.  Discussed stopping smoking.  Given current symptoms, prednisone taper as directed.  Treat acid reflux - start protonix daily.  Start claritin daily and nasacort daily.  Discussed referral to pulmonary.  Agreeable.  Continue albuterol inhaler prn.

## 2022-09-21 NOTE — Assessment & Plan Note (Signed)
Had f/u mammogram 03/2022 - recommended f/u bilateral diagnostic mammogram in one year.

## 2022-09-21 NOTE — Assessment & Plan Note (Signed)
Has buspar if needed. Follow.

## 2022-09-21 NOTE — Assessment & Plan Note (Signed)
On synthroid.  Follow tsh.   

## 2022-09-22 ENCOUNTER — Encounter: Payer: Self-pay | Admitting: Student in an Organized Health Care Education/Training Program

## 2022-09-22 ENCOUNTER — Ambulatory Visit: Payer: 59 | Admitting: Student in an Organized Health Care Education/Training Program

## 2022-09-22 VITALS — BP 118/62 | HR 93 | Temp 97.8°F | Ht 66.0 in | Wt 166.0 lb

## 2022-09-22 DIAGNOSIS — R0602 Shortness of breath: Secondary | ICD-10-CM | POA: Diagnosis not present

## 2022-09-22 DIAGNOSIS — R053 Chronic cough: Secondary | ICD-10-CM | POA: Diagnosis not present

## 2022-09-22 DIAGNOSIS — F17200 Nicotine dependence, unspecified, uncomplicated: Secondary | ICD-10-CM | POA: Diagnosis not present

## 2022-09-22 MED ORDER — BUDESONIDE-FORMOTEROL FUMARATE 160-4.5 MCG/ACT IN AERO
2.0000 | INHALATION_SPRAY | Freq: Two times a day (BID) | RESPIRATORY_TRACT | 8 refills | Status: DC
  Filled 2023-07-03: qty 10.2, 30d supply, fill #0
  Filled 2023-08-24: qty 10.2, 30d supply, fill #1

## 2022-09-22 MED ORDER — NICOTINE POLACRILEX 2 MG MT LOZG: 2.0000 mg | LOZENGE | OROMUCOSAL | 3 refills | Status: AC | PRN

## 2022-09-22 MED ORDER — NICOTINE 21 MG/24HR TD PT24: 21.0000 mg | MEDICATED_PATCH | TRANSDERMAL | 0 refills | Status: AC

## 2022-09-22 MED ORDER — NICOTINE 7 MG/24HR TD PT24: 7.0000 mg | MEDICATED_PATCH | TRANSDERMAL | 0 refills | Status: AC

## 2022-09-22 MED ORDER — NICOTINE 14 MG/24HR TD PT24: 14.0000 mg | MEDICATED_PATCH | TRANSDERMAL | 0 refills | Status: AC

## 2022-09-22 NOTE — Progress Notes (Signed)
Synopsis: Referred in for cough by Einar Pheasant, MD  Assessment & Plan:   1. Shortness of breath 2. Chronic Cough  Presenting for the evaluation of chronic cough of over one year, with a differential including Asthma, UACS, and Reflux disease. Patient has received appropriate treatment for UACS and is also on a PPI. I will obtain a pulmonary function test to evaluate for reactive airway disease as well as a double contrast esophagogram to assess for reflux. I will initiate Symbicort for empiric treatment of asthma and re-assess response on follow up. Will also order an allergen panel to assess for T-helper cell response.  - Pulmonary Function Test [facility] Only; Future - Allergen Panel (27) + IGE - budesonide-formoterol (SYMBICORT) 160-4.5 MCG/ACT inhaler; Inhale 2 puffs into the lungs in the morning and at bedtime.  Dispense: 1 each; Refill: 12 - DG ESOPHAGUS W DOUBLE CM (HD); Future  3. Tobacco use disorder  Patient has a history of nicotine dependence and is interested in quitting. Will provide with resources, patches, and lozenges to aid in cessation.  - nicotine (NICODERM CQ - DOSED IN MG/24 HR) 7 mg/24hr patch; Place 1 patch (7 mg total) onto the skin daily for 14 days.  Dispense: 14 patch; Refill: 0 - nicotine polacrilex (NICOTINE MINI) 2 MG lozenge; Take 1 lozenge (2 mg total) by mouth every 2 (two) hours as needed for smoking cessation.  Dispense: 72 lozenge; Refill: 3 - nicotine (NICODERM CQ - DOSED IN MG/24 HOURS) 21 mg/24hr patch; Place 1 patch (21 mg total) onto the skin daily.  Dispense: 42 patch; Refill: 0 - nicotine (NICODERM CQ - DOSED IN MG/24 HOURS) 14 mg/24hr patch; Place 1 patch (14 mg total) onto the skin daily for 14 days.  Dispense: 14 patch; Refill: 0   Return in about 3 months (around 12/23/2022).  I spent 60 minutes caring for this patient today, including preparing to see the patient, obtaining a medical history , reviewing a separately obtained history,  performing a medically appropriate examination and/or evaluation, counseling and educating the patient/family/caregiver, ordering medications, tests, or procedures, and documenting clinical information in the electronic health record  Armando Reichert, MD [facility] 09/22/2022 4:12 PM    End of visit medications:  Meds ordered this encounter  Medications   nicotine (NICODERM CQ - DOSED IN MG/24 HR) 7 mg/24hr patch    Sig: Place 1 patch (7 mg total) onto the skin daily for 14 days.    Dispense:  14 patch    Refill:  0   nicotine polacrilex (NICOTINE MINI) 2 MG lozenge    Sig: Take 1 lozenge (2 mg total) by mouth every 2 (two) hours as needed for smoking cessation.    Dispense:  72 lozenge    Refill:  3   nicotine (NICODERM CQ - DOSED IN MG/24 HOURS) 21 mg/24hr patch    Sig: Place 1 patch (21 mg total) onto the skin daily.    Dispense:  42 patch    Refill:  0   nicotine (NICODERM CQ - DOSED IN MG/24 HOURS) 14 mg/24hr patch    Sig: Place 1 patch (14 mg total) onto the skin daily for 14 days.    Dispense:  14 patch    Refill:  0   budesonide-formoterol (SYMBICORT) 160-4.5 MCG/ACT inhaler    Sig: Inhale 2 puffs into the lungs in the morning and at bedtime.    Dispense:  1 each    Refill:  12  Current Outpatient Medications:    albuterol (VENTOLIN HFA) 108 (90 Base) MCG/ACT inhaler, INHALE 1-2 PUFFS BY MOUTH EVERY 6 HOURS AS NEEDED FOR WHEEZE OR SHORTNESS OF BREATH, Disp: 18 each, Rfl: 0   benzonatate (TESSALON) 200 MG capsule, Take 1 capsule (200 mg total) by mouth 2 (two) times daily as needed for cough., Disp: 20 capsule, Rfl: 0   budesonide-formoterol (SYMBICORT) 160-4.5 MCG/ACT inhaler, Inhale 2 puffs into the lungs in the morning and at bedtime., Disp: 1 each, Rfl: 12   ipratropium (ATROVENT) 0.03 % nasal spray, Place 2 sprays into both nostrils every 12 (twelve) hours., Disp: 30 mL, Rfl: 0   levothyroxine (SYNTHROID) 88 MCG tablet, Take 88 mcg by mouth daily  before breakfast., Disp: , Rfl:    loratadine (CLARITIN) 10 MG tablet, Take 10 mg by mouth daily., Disp: , Rfl:    magnesium oxide (MAG-OX) 400 (240 Mg) MG tablet, TAKE 1 TABLET BY MOUTH EVERY DAY, Disp: 90 tablet, Rfl: 0   nicotine (NICODERM CQ - DOSED IN MG/24 HOURS) 14 mg/24hr patch, Place 1 patch (14 mg total) onto the skin daily for 14 days., Disp: 14 patch, Rfl: 0   nicotine (NICODERM CQ - DOSED IN MG/24 HOURS) 21 mg/24hr patch, Place 1 patch (21 mg total) onto the skin daily., Disp: 42 patch, Rfl: 0   nicotine (NICODERM CQ - DOSED IN MG/24 HR) 7 mg/24hr patch, Place 1 patch (7 mg total) onto the skin daily for 14 days., Disp: 14 patch, Rfl: 0   nicotine polacrilex (NICOTINE MINI) 2 MG lozenge, Take 1 lozenge (2 mg total) by mouth every 2 (two) hours as needed for smoking cessation., Disp: 72 lozenge, Rfl: 3   pantoprazole (PROTONIX) 40 MG tablet, Take 1 tablet (40 mg total) by mouth daily., Disp: 30 tablet, Rfl: 2   Subjective:   PATIENT ID: Audrey Ortega GENDER: female DOB: September 04, 1975, MRN: [id number]  Chief Complaint  Patient presents with   pulmonary consult    SOB with exertion and dry cough x76mo 5 rounds of prednisone- completed course yesterday.     HPI  The patient is a pleasant 47year old female presenting to clinic for the evaluation of shortness of breath.  Patient reports a cough that has been persistent for the past year, with associated wheezing and chest tightness. She reports feeling short of breath with said incidents. The cough is not productive of sputum, and is the most bothersome symptom. With every episode, she is seen by her primary care providers and has required around 5 or 6 rounds of prednisone which usually yield full resolution of the symptoms. The chest tightness, wheeze, and shortness of breath resolve with the course of prednisone and antibiotics. She does not have any childhood history of asthma or respiratory illness, but does report having similar  symptoms around 10 years ago. At that time, she was given Symbicort which helped resolve her symptoms. She was seen by her PCP recently and was prescribed a combination of intranasal steroids, claritin, and a PPI. She has felt symptoms were stable since her last round of prednisone and with the current regimen.  [place] She did get separated from her husband and has moved houses 1 year ago. She used to have pets but no longer. No current pets at home, no birds, and no mold noted. She works from home, and has always worked a dDesigner, multimedia She smoked cigarettes from the  age of 22, and currently smoking 1 pack per day.  Ancillary information including prior medications, full medical/surgical/family/social histories, and PFTs (when available) are listed below and have been reviewed.   Review of Systems  Constitutional:  Negative for chills, fever and weight loss.  Respiratory:  Positive for cough, shortness of breath and wheezing. Negative for hemoptysis and sputum production.   Cardiovascular:  Negative for chest pain.     Objective:   Vitals:   09/22/22 1524  BP: 118/62  Pulse: 93  Temp: 97.8 F (36.6 C)  TempSrc: Temporal  SpO2: 98%  Weight: 166 lb (75.3 kg)  Height: '5\' 6"'$  (1.676 m)   98% on RA  BMI Readings from Last 3 Encounters:  09/22/22 26.79 kg/m  09/10/22 26.34 kg/m  08/21/22 25.92 kg/m   Wt Readings from Last 3 Encounters:  09/22/22 166 lb (75.3 kg)  09/10/22 163 lb 3.2 oz (74 kg)  08/21/22 160 lb 9.6 oz (72.8 kg)    Physical Exam Constitutional:      Appearance: Normal appearance. She is not ill-appearing.  Cardiovascular:     Rate and Rhythm: Normal rate and regular rhythm.     Pulses: Normal pulses.     Heart sounds: Normal heart sounds.  Pulmonary:     Effort: Pulmonary effort is normal.     Breath sounds: Normal breath sounds.  Musculoskeletal:     Right lower leg: No edema.     Left lower leg: No  edema.  Neurological:     General: No focal deficit present.     Mental Status: She is alert and oriented to person, place, and time. Mental status is at baseline.     Ancillary Information    Past Medical History:  Diagnosis Date   Allergy    Anxiety    Chicken pox    Depression    Emphysema of lung (HCC)    Frequent headaches    Hypothyroidism    Migraine      Family History  Problem Relation Age of Onset   Arthritis Mother    Hypertension Mother    Diabetes Mother    Arthritis Father    Hypertension Father    Hyperlipidemia Father    Thyroid disease Father        Hypothyroidism   Healthy Daughter    Breast cancer Maternal Aunt 76   Cancer Maternal Aunt        Breast   Alcohol abuse Maternal Grandmother    Diabetes Paternal Grandmother    Hypertension Paternal Grandmother    Diabetes Paternal Grandfather    Alcohol abuse Paternal Grandfather    Hypertension Paternal Grandfather    Heart attack Brother    Hyperlipidemia Brother    Stroke Brother      Past Surgical History:  Procedure Laterality Date   NO PAST SURGERIES      Social History   Socioeconomic History   Marital status: Legally Separated    Spouse name: Not on file   Number of children: 1   Years of education: Not on file   Highest education level: Not on file  Occupational History   Occupation: Radiation protection practitioner: [employer]  Tobacco Use   Smoking status: Every Day    Packs/day: 1.00    Years: 30.00    Total pack years: 30.00    Types: Cigarettes   Smokeless tobacco: Never  Vaping Use   Vaping Use: Never used  Substance and Sexual  Activity   Alcohol use: Yes    Comment: Rare use   Drug use: No   Sexual activity: Yes    Partners: Male  Other Topics Concern   Not on file  Social History Narrative   Not on file   Social Determinants of Health   Financial Resource Strain: Not on file  Food Insecurity: Not on file  Transportation Needs: Not on file   Physical Activity: Not on file  Stress: Not on file  Social Connections: Not on file  Intimate Partner Violence: Not on file     Allergies  Allergen Reactions   Amoxicillin-Pot Clavulanate Hives and Rash     CBC    Component Value Date/Time   WBC 8.0 10/22/2021 0752   RBC 4.21 10/22/2021 0752   HGB 13.1 10/22/2021 0752   HGB 13.8 03/11/2013 1815   HCT 39.1 10/22/2021 0752   HCT 42.3 03/11/2013 1815   PLT 302.0 10/22/2021 0752   PLT 269 03/11/2013 1815   MCV 92.9 10/22/2021 0752   MCV 88 03/11/2013 1815   MCH 30.8 01/04/2021 1259   MCHC 33.4 10/22/2021 0752   RDW 13.6 10/22/2021 0752   RDW 13.1 03/11/2013 1815   LYMPHSABS 1.6 10/22/2021 0752   LYMPHSABS 1.7 03/11/2013 1815   MONOABS 0.5 10/22/2021 0752   MONOABS 0.9 03/11/2013 1815   EOSABS 0.3 10/22/2021 0752   EOSABS 0.3 03/11/2013 1815   BASOSABS 0.2 (H) 10/22/2021 0752   BASOSABS 0.2 (H) 03/11/2013 1815    Pulmonary Functions Testing Results:     No data to display          Outpatient Medications Prior to Visit  Medication Sig Dispense Refill   albuterol (VENTOLIN HFA) 108 (90 Base) MCG/ACT inhaler INHALE 1-2 PUFFS BY MOUTH EVERY 6 HOURS AS NEEDED FOR WHEEZE OR SHORTNESS OF BREATH 18 each 0   benzonatate (TESSALON) 200 MG capsule Take 1 capsule (200 mg total) by mouth 2 (two) times daily as needed for cough. 20 capsule 0   ipratropium (ATROVENT) 0.03 % nasal spray Place 2 sprays into both nostrils every 12 (twelve) hours. 30 mL 0   levothyroxine (SYNTHROID) 88 MCG tablet Take 88 mcg by mouth daily before breakfast.     loratadine (CLARITIN) 10 MG tablet Take 10 mg by mouth daily.     magnesium oxide (MAG-OX) 400 (240 Mg) MG tablet TAKE 1 TABLET BY MOUTH EVERY DAY 90 tablet 0   pantoprazole (PROTONIX) 40 MG tablet Take 1 tablet (40 mg total) by mouth daily. 30 tablet 2   predniSONE (DELTASONE) 10 MG tablet Take 6 tablets x 1 day and then decrease by 1/2 tablet per day until down to zero mg. 39 tablet 0    No facility-administered medications prior to visit.

## 2022-09-22 NOTE — Patient Instructions (Signed)
Today, I ordered blood work. You can get them draw at your preferred [facility] draw station. The nearest one to [facility] [place] is at nearby [place] ([place], [place], [place] [address]).  The [facility] Quitline: Call 1-800-QUIT-NOW 854-233-5412). The [place] Quitline is a free service for [employer]. Trained counselors are available from 8 am until 3 am, 365 days per year. Services are available in both [place] and [place].   Web Resources Free online support programs can help you track your progress and share experiences with others who are quitting. These are examples: www.becomeanex.org www.trytostop.org  www.smokefree.gov  www.[employer].com.br.aspx  [facility]: offers comprehensive in-person tobacco treatment counseling at [facility] ([address], [place] [address]).  Open to everyone. Virtual appointments available. Free parking. Call [phone] to schedule an appointment or [phone] for general information.    Tobacco Cessation Medications  Nicotine Replacement Therapy (NRT)  Nicotine is the addictive part of tobacco smoke, but not the most dangerous part. There are 7000 other toxins in cigarettes, including carbon monoxide, that cause disease. People do not generally become addicted to medication. Common problems: People don't use enough medication or stop too early. Medications are safe and effective. Overdose is very uncommon. Use medications as long as needed (3 months minimum). Some combinations work better than single medications. Long acting medications like the NRT patch and bupropion provide continuous treatment for withdrawal symptoms.  PLUS  Short acting medications like the NRT gum, lozenge, inhaler, and nasal spray help people to cope with breakthrough cravings.  ? Nicotine Patch  Place patch on hairless skin on upper body, including arms and back. Each day: discard old patch,  shower, apply new patch to a different site. Apply hydrocortisone cream to mildly red/irritated areas. Call provider if rash develops. If patch causes sleep disturbance, remove patch at bedtime and replace each morning after shower. Side effects may include: skin irritation, headache, insomnia, abnormal/vivid dreams.  ? Nicotine Gum  Chew gum slowly, park in cheek when peppery taste or tingling sensation begins (about 15-30 chews). When taste or tingling goes away, begin chewing again. Use until nicotine is gone (taste or tingle does not return, usually 30 minutes). Park in different areas of mouth. Nicotine is absorbed through the lining of the mouth. Use enough to control cravings, up to 24 pieces per day (if used alone). Avoid eating or drinking for 15 minutes before using and during use. Side effects may include: mouth/jaw soreness, hiccups, indigestion, hypersalivation.  If gum is not chewed correctly, additional side effects may include lightheadedness, nausea/vomiting, throat and mouth irritation.  ? Nicotine Lozenge  Allow to dissolve slowly in mouth (20-30 minutes). Do not chew or swallow. Nicotine release may cause a warm tingling sensation. Occasionally rotate to different areas of the mouth. Use enough to control cravings, up to 20 lozenges per day (if used alone). Avoid eating or drinking for 15 minutes before using and during use. Side effects may include: nausea, hiccups, cough, heartburn, headache, gas, insomnia.  ? Nicotine Nasal Spray Use 1 spray in each nostril (1 dose) and tilt head back for 1 minute. Do not sniff, swallow, or inhale through nose.  Use at least 8 doses (1 spray in each nostril) , up to 40 doses per day (if used alone). To reduce nasal irritation, spray on cotton swab and insert into nose. Side effects may include: nasal and/or throat irritation (hot, peppery, or burning sensation), nasal irritation, tearing, sneezing, cough, headache.  ?  Nicotine Oral  Inhaler (puffer) Inhale into the back of the throat or puff in short breaths. Do not inhale into the lungs.  Puff continuously for 20 minutes (about 80 puffs) until cartridge is empty. Change cartridge when it loses the "burning in throat" sensation (feels like air only). Open cartridges can be saved and used again within 24 hours. Use at least 6 and up to 16 cartridges per day (if used alone).  Avoid eating or drinking for 15 minutes before using and during use. Side effects may include: mouth and/or throat irritation, unpleasant taste, cough, nasal irritation, indigestion, hiccups, headache.

## 2022-09-25 ENCOUNTER — Other Ambulatory Visit: Payer: Self-pay | Admitting: Internal Medicine

## 2022-09-25 DIAGNOSIS — N920 Excessive and frequent menstruation with regular cycle: Secondary | ICD-10-CM | POA: Insufficient documentation

## 2022-09-25 DIAGNOSIS — N921 Excessive and frequent menstruation with irregular cycle: Secondary | ICD-10-CM | POA: Insufficient documentation

## 2022-10-01 ENCOUNTER — Ambulatory Visit
Admission: RE | Admit: 2022-10-01 | Discharge: 2022-10-01 | Disposition: A | Discharge status: Home / Self Care | Payer: 59 | Source: Ambulatory Visit | Attending: Student in an Organized Health Care Education/Training Program | Admitting: Student in an Organized Health Care Education/Training Program

## 2022-10-01 DIAGNOSIS — R053 Chronic cough: Secondary | ICD-10-CM | POA: Diagnosis present

## 2022-10-01 DIAGNOSIS — R0602 Shortness of breath: Secondary | ICD-10-CM | POA: Insufficient documentation

## 2022-11-16 ENCOUNTER — Other Ambulatory Visit: Payer: Self-pay | Admitting: Internal Medicine

## 2022-12-05 ENCOUNTER — Other Ambulatory Visit: Payer: Self-pay | Admitting: Internal Medicine

## 2022-12-09 ENCOUNTER — Ambulatory Visit (INDEPENDENT_AMBULATORY_CARE_PROVIDER_SITE_OTHER): Payer: 59 | Admitting: Internal Medicine

## 2022-12-09 ENCOUNTER — Encounter: Payer: Self-pay | Admitting: Internal Medicine

## 2022-12-09 VITALS — BP 126/72 | HR 89 | Temp 97.9°F | Resp 16 | Ht 67.0 in | Wt 167.6 lb

## 2022-12-09 DIAGNOSIS — Z1211 Encounter for screening for malignant neoplasm of colon: Secondary | ICD-10-CM | POA: Diagnosis not present

## 2022-12-09 DIAGNOSIS — K219 Gastro-esophageal reflux disease without esophagitis: Secondary | ICD-10-CM | POA: Diagnosis not present

## 2022-12-09 DIAGNOSIS — E039 Hypothyroidism, unspecified: Secondary | ICD-10-CM

## 2022-12-09 DIAGNOSIS — R928 Other abnormal and inconclusive findings on diagnostic imaging of breast: Secondary | ICD-10-CM | POA: Diagnosis not present

## 2022-12-09 DIAGNOSIS — F17200 Nicotine dependence, unspecified, uncomplicated: Secondary | ICD-10-CM

## 2022-12-09 DIAGNOSIS — Z0000 Encounter for general adult medical examination without abnormal findings: Secondary | ICD-10-CM | POA: Diagnosis not present

## 2022-12-09 DIAGNOSIS — R059 Cough, unspecified: Secondary | ICD-10-CM

## 2022-12-09 DIAGNOSIS — Z124 Encounter for screening for malignant neoplasm of cervix: Secondary | ICD-10-CM

## 2022-12-09 NOTE — Patient Instructions (Signed)
Pepcid (famotidine) 20mg - take one tablet 30 minutes before your evening meal.   

## 2022-12-09 NOTE — Assessment & Plan Note (Addendum)
Physical today 12/09/22.  Saw gyn 03/25/22.  Last pap 03/21/21 - NILM, hrHPV negative. Mammogram 04/11/22 - birads III.  Recommended diagnostic bilateral mammogram in one year. Due colonoscopy.

## 2022-12-09 NOTE — Addendum Note (Signed)
Addended by: Rita Ohara D on: 12/09/2022 10:01 AM   Modules accepted: Orders

## 2022-12-09 NOTE — Progress Notes (Signed)
Subjective:    Patient ID: Audrey Ortega, female    DOB: 05-04-1976, 47 y.o.   MRN: [id number]  Patient here for  Chief Complaint  Patient presents with   Annual Exam    HPI Here for a physical exam.   Saw gyn 03/25/22.  Last pap 03/21/21 - NILM, hrHPV negative.  Up to date with mammogram.  Still smoking.  Tried nicotine lozenges.  Has nicotine patches. Plans to continue to try to cut down.  Previous persistent cough.  Treated with prednisone taper.  Also started protonix, claritin and nasacort.  Was referred to pulmonary.  Evaluated 09/22/22 - recommended PFTs with double contrast esophagogram.  Was started on symbicort.  PFTs scheduled.  Has f/u with pulmonary next month.  Cough is better.  Persistent acid reflux.  Taking protonix daily.  Still with break through symptoms a few times per week.  Due colonoscopy. Given persistent acid reflux, will have them evaluate for question of EGD as well.  Sees Dr Gershon Crane - f/u hypothyroidism. Last seen 11/19/22.  TSH wnl.  Continue synthroid.  Vitamin D level - 36.  She did have two falls.  First approximately 4 weeks ago.  Missed a step and hit her right hip.  The second fall was a week later and she slipped in the bathtub.  Larey Seat on her left side and caught herself with her right arm.  No shoulder pain now.  Right lateral hip has been bothering her, but is better today.  No head injury.  Has been taking ibuprofen prn.  Has been monitoring carb intake.  No soft drinks.  Discussed exercise.    Past Medical History:  Diagnosis Date   Allergy    Anxiety    Chicken pox    Depression    Emphysema of lung (HCC)    Frequent headaches    Hypothyroidism    Migraine    Past Surgical History:  Procedure Laterality Date   NO PAST SURGERIES     Family History  Problem Relation Age of Onset   Arthritis Mother    Hypertension Mother    Diabetes Mother    Arthritis Father    Hypertension Father    Hyperlipidemia Father    Thyroid disease Father         Hypothyroidism   Healthy Daughter    Breast cancer Maternal Aunt 48   Cancer Maternal Aunt        Breast   Alcohol abuse Maternal Grandmother    Diabetes Paternal Grandmother    Hypertension Paternal Grandmother    Diabetes Paternal Grandfather    Alcohol abuse Paternal Grandfather    Hypertension Paternal Grandfather    Heart attack Brother    Hyperlipidemia Brother    Stroke Brother    Social History   Socioeconomic History   Marital status: Legally Separated    Spouse name: Not on file   Number of children: 1   Years of education: Not on file   Highest education level: Not on file  Occupational History   Occupation: Research scientist (physical sciences): [employer]  Tobacco Use   Smoking status: Every Day    Packs/day: 1.00    Years: 30.00    Additional pack years: 0.00    Total pack years: 30.00    Types: Cigarettes   Smokeless tobacco: Never  Vaping Use   Vaping Use: Never used  Substance and Sexual Activity   Alcohol use: Yes    Comment:  Rare use   Drug use: No   Sexual activity: Yes    Partners: Male  Other Topics Concern   Not on file  Social History Narrative   Not on file   Social Determinants of Health   Financial Resource Strain: Not on file  Food Insecurity: Not on file  Transportation Needs: Not on file  Physical Activity: Not on file  Stress: Not on file  Social Connections: Not on file     Review of Systems  Constitutional:  Negative for appetite change and unexpected weight change.  HENT:  Negative for congestion and sinus pressure.   Respiratory:  Negative for cough, chest tightness and shortness of breath.   Cardiovascular:  Negative for chest pain, palpitations and leg swelling.  Gastrointestinal:  Negative for abdominal pain, diarrhea, nausea and vomiting.       Persistent acid reflux despite taking protonix.   Genitourinary:  Negative for difficulty urinating and dysuria.  Musculoskeletal:  Negative for joint swelling and  myalgias.  Skin:  Negative for color change and rash.  Neurological:  Negative for dizziness and headaches.  Psychiatric/Behavioral:  Negative for agitation and dysphoric mood.        Objective:     BP 126/72   Pulse 89   Temp 97.9 F (36.6 C)   Resp 16   Ht 5\' 7"  (1.702 m)   Wt 167 lb 9.6 oz (76 kg)   SpO2 98%   BMI 26.25 kg/m  Wt Readings from Last 3 Encounters:  12/09/22 167 lb 9.6 oz (76 kg)  09/22/22 166 lb (75.3 kg)  09/10/22 163 lb 3.2 oz (74 kg)    Physical Exam Vitals reviewed.  Constitutional:      General: She is not in acute distress.    Appearance: Normal appearance.  HENT:     Head: Normocephalic and atraumatic.     Right Ear: External ear normal.     Left Ear: External ear normal.  Eyes:     General: No scleral icterus.       Right eye: No discharge.        Left eye: No discharge.     Conjunctiva/sclera: Conjunctivae normal.  Neck:     Thyroid: No thyromegaly.  Cardiovascular:     Rate and Rhythm: Normal rate and regular rhythm.  Pulmonary:     Effort: No respiratory distress.     Breath sounds: Normal breath sounds. No wheezing.  Abdominal:     General: Bowel sounds are normal.     Palpations: Abdomen is soft.     Tenderness: There is no abdominal tenderness.  Musculoskeletal:        General: No swelling or tenderness.     Cervical back: Neck supple. No tenderness.  Lymphadenopathy:     Cervical: No cervical adenopathy.  Skin:    Findings: No erythema or rash.  Neurological:     Mental Status: She is alert.  Psychiatric:        Mood and Affect: Mood normal.        Behavior: Behavior normal.      Outpatient Encounter Medications as of 12/09/2022  Medication Sig   budesonide-formoterol (SYMBICORT) 160-4.5 MCG/ACT inhaler Inhale 2 puffs into the lungs in the morning and at bedtime.   ipratropium (ATROVENT) 0.03 % nasal spray Place 2 sprays into both nostrils every 12 (twelve) hours.   levothyroxine (SYNTHROID) 88 MCG tablet Take 88  mcg by mouth daily before breakfast.   loratadine (CLARITIN) 10 MG  tablet Take 10 mg by mouth daily.   magnesium oxide (MAG-OX) 400 (240 Mg) MG tablet TAKE 1 TABLET BY MOUTH EVERY DAY (Patient not taking: Reported on 12/09/2022)   nicotine polacrilex (NICOTINE MINI) 2 MG lozenge Take 1 lozenge (2 mg total) by mouth every 2 (two) hours as needed for smoking cessation.   pantoprazole (PROTONIX) 40 MG tablet TAKE 1 TABLET BY MOUTH EVERY DAY   [DISCONTINUED] albuterol (VENTOLIN HFA) 108 (90 Base) MCG/ACT inhaler INHALE 1-2 PUFFS BY MOUTH EVERY 6 HOURS AS NEEDED FOR WHEEZE OR SHORTNESS OF BREATH   [DISCONTINUED] benzonatate (TESSALON) 200 MG capsule Take 1 capsule (200 mg total) by mouth 2 (two) times daily as needed for cough.   No facility-administered encounter medications on file as of 12/09/2022.     Lab Results  Component Value Date   WBC 8.0 10/22/2021   HGB 13.1 10/22/2021   HCT 39.1 10/22/2021   PLT 302.0 10/22/2021   GLUCOSE 82 10/22/2021   CHOL 155 10/22/2021   TRIG 52.0 10/22/2021   HDL 46.70 10/22/2021   LDLCALC 98 10/22/2021   ALT 8 10/22/2021   AST 13 10/22/2021   NA 137 10/22/2021   K 4.3 10/22/2021   CL 104 10/22/2021   CREATININE 0.74 10/22/2021   BUN 6 10/22/2021   CO2 29 10/22/2021   TSH 3.87 10/22/2021    DG ESOPHAGUS W DOUBLE CM (HD)  Result Date: 10/01/2022 CLINICAL DATA:  Patient with chronic cough, heartburn symptoms at night. Patient reports 30 year smoking history of 1 pack per day EXAM: ESOPHAGUS/BARIUM SWALLOW/TABLET STUDY TECHNIQUE: Combined double and single contrast examination was performed using effervescent crystals, high-density barium, and thin liquid barium. This exam was performed by Mina Marble, PA-C , and was supervised and interpreted by Dr Carey Bullocks. FLUOROSCOPY: Radiation Exposure Index (as provided by the fluoroscopic device): 13.40 mGy Kerma COMPARISON:  Chest radiographs 06/04/2022.  Chest CT 08/09/2008. FINDINGS: Swallowing: Appears  normal. No vestibular penetration or aspiration seen. Pharynx: Unremarkable. Esophagus: No evidence of stricture, mass or ulceration. Suboptimal distensibility of the esophagus on the air-contrast views. Esophageal motility: Within normal limits. Hiatal Hernia: None. Gastroesophageal reflux: None visualized. Ingested 13mm barium tablet: Passed normally Other: Mild cervical spondylosis noted. IMPRESSION: Suboptimal distensibility of the esophagus on the air-contrast views. Otherwise unremarkable esophagram. No significant abnormality identified. Electronically Signed   By: Carey Bullocks M.D.   On: 10/01/2022 10:37       Assessment & Plan:  Routine general medical examination at a health care facility  Healthcare maintenance Assessment & Plan: Physical today 12/09/22.  Saw gyn 03/25/22.  Last pap 03/21/21 - NILM, hrHPV negative. Mammogram 04/11/22 - birads III.  Recommended diagnostic bilateral mammogram in one year. Due colonoscopy.    Gastroesophageal reflux disease, unspecified whether esophagitis present Assessment & Plan: Persistent acid reflux despite daily protonix.  Add pepcid before evening meal.  Planning GI evaluation for question of EGD.   Orders: -     Ambulatory referral to Gastroenterology  Colon cancer screening -     Ambulatory referral to Gastroenterology  Abnormal mammogram Assessment & Plan: Had f/u mammogram 03/2022 - recommended f/u bilateral diagnostic mammogram in one year.     Cervical cancer screening Assessment & Plan: Seeing Dr Dalbert Garnet.  PAP 03/2021.    Cough, unspecified type Assessment & Plan: Dr DgalyiPulmonary Function Test - Allergen Panel (27) + IGE - budesonide-formoterol (SYMBICORT) 160-4.5 MCG/ACT inhaler; Inhale 2 puffs into the lungs in the morning and at bedtime.  Dispense:  1 each; Refill: 12 - DG ESOPHAGUS W DOUBLE CM (HD);  Continue protonix and add pepcid.  Continue claritin and nasacort.    Hypothyroidism, unspecified type Assessment &  Plan: On synthroid.  Follow tsh.    Tobacco use disorder Assessment & Plan: Smokes.  Has minimal COPD.  Uses symbicort.  Has rescue inhaler if needed.  Needs to quit.  Follow.        Dale Delaplaine, MD

## 2022-12-15 ENCOUNTER — Encounter: Payer: Self-pay | Admitting: Internal Medicine

## 2022-12-15 NOTE — Assessment & Plan Note (Signed)
Persistent acid reflux despite daily protonix.  Add pepcid before evening meal.  Planning GI evaluation for question of EGD.

## 2022-12-15 NOTE — Assessment & Plan Note (Signed)
Dr DgalyiPulmonary Function Test - Allergen Panel (27) + IGE - budesonide-formoterol (SYMBICORT) 160-4.5 MCG/ACT inhaler; Inhale 2 puffs into the lungs in the morning and at bedtime.  Dispense: 1 each; Refill: 12 - DG ESOPHAGUS W DOUBLE CM (HD);  Continue protonix and add pepcid.  Continue claritin and nasacort.

## 2022-12-15 NOTE — Assessment & Plan Note (Signed)
Had f/u mammogram 03/2022 - recommended f/u bilateral diagnostic mammogram in one year.   

## 2022-12-15 NOTE — Assessment & Plan Note (Signed)
Smokes.  Has minimal COPD.  Uses symbicort.  Has rescue inhaler if needed.  Needs to quit.  Follow.

## 2022-12-15 NOTE — Assessment & Plan Note (Signed)
On synthroid.  Follow tsh.   

## 2022-12-15 NOTE — Assessment & Plan Note (Signed)
Seeing Dr Beasley.  PAP 03/2021.  

## 2022-12-16 ENCOUNTER — Ambulatory Visit: Payer: 59 | Attending: Student in an Organized Health Care Education/Training Program

## 2022-12-23 ENCOUNTER — Ambulatory Visit: Payer: 59 | Admitting: Student in an Organized Health Care Education/Training Program

## 2023-02-18 ENCOUNTER — Encounter (INDEPENDENT_AMBULATORY_CARE_PROVIDER_SITE_OTHER): Payer: Self-pay

## 2023-03-04 DIAGNOSIS — H00021 Hordeolum internum right upper eyelid: Secondary | ICD-10-CM | POA: Diagnosis not present

## 2023-03-11 ENCOUNTER — Ambulatory Visit: Payer: 59 | Admitting: Internal Medicine

## 2023-03-18 ENCOUNTER — Encounter: Payer: Self-pay | Admitting: Internal Medicine

## 2023-03-18 ENCOUNTER — Ambulatory Visit: Payer: 59 | Admitting: Internal Medicine

## 2023-03-18 VITALS — BP 114/72 | HR 72 | Temp 97.9°F | Resp 16 | Ht 66.0 in | Wt 168.0 lb

## 2023-03-18 DIAGNOSIS — K219 Gastro-esophageal reflux disease without esophagitis: Secondary | ICD-10-CM

## 2023-03-18 DIAGNOSIS — R5383 Other fatigue: Secondary | ICD-10-CM | POA: Diagnosis not present

## 2023-03-18 DIAGNOSIS — E039 Hypothyroidism, unspecified: Secondary | ICD-10-CM

## 2023-03-18 DIAGNOSIS — R928 Other abnormal and inconclusive findings on diagnostic imaging of breast: Secondary | ICD-10-CM

## 2023-03-18 DIAGNOSIS — N921 Excessive and frequent menstruation with irregular cycle: Secondary | ICD-10-CM

## 2023-03-18 DIAGNOSIS — E559 Vitamin D deficiency, unspecified: Secondary | ICD-10-CM

## 2023-03-18 DIAGNOSIS — F17200 Nicotine dependence, unspecified, uncomplicated: Secondary | ICD-10-CM

## 2023-03-18 DIAGNOSIS — N63 Unspecified lump in unspecified breast: Secondary | ICD-10-CM

## 2023-03-18 DIAGNOSIS — R059 Cough, unspecified: Secondary | ICD-10-CM

## 2023-03-18 LAB — CBC WITH DIFFERENTIAL/PLATELET
Basophils Absolute: 0.1 10*3/uL (ref 0.0–0.1)
Basophils Relative: 1 % (ref 0.0–3.0)
Eosinophils Absolute: 0.2 10*3/uL (ref 0.0–0.7)
Eosinophils Relative: 2.7 % (ref 0.0–5.0)
HCT: 40.1 % (ref 36.0–46.0)
Hemoglobin: 13.2 g/dL (ref 12.0–15.0)
Lymphocytes Relative: 24.7 % (ref 12.0–46.0)
Lymphs Abs: 1.9 10*3/uL (ref 0.7–4.0)
MCHC: 33 g/dL (ref 30.0–36.0)
MCV: 92.7 fl (ref 78.0–100.0)
Monocytes Absolute: 0.6 10*3/uL (ref 0.1–1.0)
Monocytes Relative: 7.9 % (ref 3.0–12.0)
Neutro Abs: 5 10*3/uL (ref 1.4–7.7)
Neutrophils Relative %: 63.7 % (ref 43.0–77.0)
Platelets: 327 10*3/uL (ref 150.0–400.0)
RBC: 4.32 Mil/uL (ref 3.87–5.11)
RDW: 13.6 % (ref 11.5–15.5)
WBC: 7.8 10*3/uL (ref 4.0–10.5)

## 2023-03-18 LAB — COMPREHENSIVE METABOLIC PANEL
ALT: 14 U/L (ref 0–35)
AST: 18 U/L (ref 0–37)
Albumin: 3.9 g/dL (ref 3.5–5.2)
Alkaline Phosphatase: 51 U/L (ref 39–117)
BUN: 9 mg/dL (ref 6–23)
CO2: 30 mEq/L (ref 19–32)
Calcium: 9.1 mg/dL (ref 8.4–10.5)
Chloride: 103 mEq/L (ref 96–112)
Creatinine, Ser: 0.73 mg/dL (ref 0.40–1.20)
GFR: 97.38 mL/min (ref >60.00)
Glucose, Bld: 82 mg/dL (ref 70–99)
Potassium: 4 mEq/L (ref 3.5–5.1)
Sodium: 139 mEq/L (ref 135–145)
Total Bilirubin: 0.4 mg/dL (ref 0.2–1.2)
Total Protein: 6.9 g/dL (ref 6.0–8.3)

## 2023-03-18 LAB — TSH: TSH: 10.59 u[IU]/mL — ABNORMAL HIGH (ref 0.35–5.50)

## 2023-03-18 LAB — IBC + FERRITIN
Ferritin: 17.7 ng/mL (ref 10.0–291.0)
Iron: 55 ug/dL (ref 42–145)
Saturation Ratios: 16.7 % — ABNORMAL LOW (ref 20.0–50.0)
TIBC: 329 ug/dL (ref 250.0–450.0)
Transferrin: 235 mg/dL (ref 212.0–360.0)

## 2023-03-18 LAB — VITAMIN D 25 HYDROXY (VIT D DEFICIENCY, FRACTURES): VITD: 20.96 ng/mL — ABNORMAL LOW (ref 30.00–100.00)

## 2023-03-18 NOTE — Assessment & Plan Note (Signed)
Increased fatigue.  Discussed.  Given increased bleeding, will check cbc and iron studies.  Also check metabolic panel and thyroid function.

## 2023-03-18 NOTE — Assessment & Plan Note (Signed)
   Discussed smoking cessation. 

## 2023-03-18 NOTE — Progress Notes (Signed)
Subjective:    Patient ID: Audrey Ortega, female    DOB: 1976-01-24, 47 y.o.   MRN: [id number]  Patient here for  Chief Complaint  Patient presents with   Medical Management of Chronic Issues    HPI Here to follow up regarding GERD, previous cough and hypothyroidism.  Sees Dr Gershon Crane - f/u hypothyroidism. Last seen 11/19/22. TSH wnl. Previous persistent cough. Treated with prednisone taper. Also started protonix, claritin and nasacort. Was referred to pulmonary. Evaluated 09/22/22 - recommended PFTs with double contrast esophagogram. Was started on symbicort. PFTs ordered. Did not have - doing better. No cough.  No sob. Not requiring symbicort. Esophagogram - no significant abnormality. On protonix.  Acid reflux better.  No problems now with acid reflux.  Wanting to hold on colonoscopy currently - just changed jobs.  Will notify me when ready to schedule.  Does report increased menstrual bleeding - period - every other week.  Heavier at times.  Bowels stable.  No problems with bowels reported.    Past Medical History:  Diagnosis Date   Allergy    Anxiety    Chicken pox    Depression    Emphysema of lung (HCC)    Frequent headaches    Hypothyroidism    Migraine    Past Surgical History:  Procedure Laterality Date   NO PAST SURGERIES     Family History  Problem Relation Age of Onset   Arthritis Mother    Hypertension Mother    Diabetes Mother    Arthritis Father    Hypertension Father    Hyperlipidemia Father    Thyroid disease Father        Hypothyroidism   Healthy Daughter    Breast cancer Maternal Aunt 60   Cancer Maternal Aunt        Breast   Alcohol abuse Maternal Grandmother    Diabetes Paternal Grandmother    Hypertension Paternal Grandmother    Diabetes Paternal Grandfather    Alcohol abuse Paternal Grandfather    Hypertension Paternal Grandfather    Heart attack Brother    Hyperlipidemia Brother    Stroke Brother    Social History   Socioeconomic History    Marital status: Legally Separated    Spouse name: Not on file   Number of children: 1   Years of education: Not on file   Highest education level: Not on file  Occupational History   Occupation: Research scientist (physical sciences): [employer]  Tobacco Use   Smoking status: Every Day    Current packs/day: 1.00    Average packs/day: 1 pack/day for 30.0 years (30.0 ttl pk-yrs)    Types: Cigarettes   Smokeless tobacco: Never  Vaping Use   Vaping status: Never Used  Substance and Sexual Activity   Alcohol use: Yes    Comment: Rare use   Drug use: No   Sexual activity: Yes    Partners: Male  Other Topics Concern   Not on file  Social History Narrative   Not on file   Social Determinants of Health   Financial Resource Strain: Not on file  Food Insecurity: Not on file  Transportation Needs: Not on file  Physical Activity: Not on file  Stress: Not on file  Social Connections: Not on file     Review of Systems  Constitutional:  Positive for fatigue. Negative for appetite change and unexpected weight change.  HENT:  Negative for congestion, sinus pressure and sore throat.  Eyes:  Negative for pain and visual disturbance.  Respiratory:  Negative for cough, chest tightness and shortness of breath.   Cardiovascular:  Negative for chest pain, palpitations and leg swelling.  Gastrointestinal:  Negative for abdominal pain, diarrhea, nausea and vomiting.  Genitourinary:  Negative for difficulty urinating and dysuria.  Musculoskeletal:  Negative for joint swelling and myalgias.  Skin:  Negative for color change and rash.  Neurological:  Negative for dizziness and headaches.  Hematological:  Negative for adenopathy. Does not bruise/bleed easily.  Psychiatric/Behavioral:  Negative for agitation and dysphoric mood.        Objective:     BP 114/72   Pulse 72   Temp 97.9 F (36.6 C)   Resp 16   Ht 5\' 6"  (1.676 m)   Wt 168 lb (76.2 kg)   SpO2 99%   BMI 27.12 kg/m   Wt Readings from Last 3 Encounters:  03/18/23 168 lb (76.2 kg)  12/09/22 167 lb 9.6 oz (76 kg)  09/22/22 166 lb (75.3 kg)    Physical Exam Vitals reviewed.  Constitutional:      General: She is not in acute distress.    Appearance: Normal appearance.  HENT:     Head: Normocephalic and atraumatic.     Right Ear: External ear normal.     Left Ear: External ear normal.  Eyes:     General: No scleral icterus.       Right eye: No discharge.        Left eye: No discharge.     Conjunctiva/sclera: Conjunctivae normal.  Neck:     Thyroid: No thyromegaly.  Cardiovascular:     Rate and Rhythm: Normal rate and regular rhythm.  Pulmonary:     Effort: No respiratory distress.     Breath sounds: Normal breath sounds. No wheezing.  Abdominal:     General: Bowel sounds are normal.     Palpations: Abdomen is soft.     Tenderness: There is no abdominal tenderness.  Musculoskeletal:        General: No swelling or tenderness.     Cervical back: Neck supple. No tenderness.  Lymphadenopathy:     Cervical: No cervical adenopathy.  Skin:    Findings: No erythema or rash.  Neurological:     Mental Status: She is alert.  Psychiatric:        Mood and Affect: Mood normal.        Behavior: Behavior normal.      Outpatient Encounter Medications as of 03/18/2023  Medication Sig   budesonide-formoterol (SYMBICORT) 160-4.5 MCG/ACT inhaler Inhale 2 puffs into the lungs in the morning and at bedtime.   ipratropium (ATROVENT) 0.03 % nasal spray Place 2 sprays into both nostrils every 12 (twelve) hours.   levothyroxine (SYNTHROID) 88 MCG tablet Take 88 mcg by mouth daily before breakfast.   loratadine (CLARITIN) 10 MG tablet Take 10 mg by mouth daily.   pantoprazole (PROTONIX) 40 MG tablet TAKE 1 TABLET BY MOUTH EVERY DAY   [DISCONTINUED] magnesium oxide (MAG-OX) 400 (240 Mg) MG tablet TAKE 1 TABLET BY MOUTH EVERY DAY (Patient not taking: Reported on 12/09/2022)   No facility-administered encounter  medications on file as of 03/18/2023.     Lab Results  Component Value Date   WBC 8.0 10/22/2021   HGB 13.1 10/22/2021   HCT 39.1 10/22/2021   PLT 302.0 10/22/2021   GLUCOSE 82 10/22/2021   CHOL 155 10/22/2021   TRIG 52.0 10/22/2021   HDL 46.70  10/22/2021   LDLCALC 98 10/22/2021   ALT 8 10/22/2021   AST 13 10/22/2021   NA 137 10/22/2021   K 4.3 10/22/2021   CL 104 10/22/2021   CREATININE 0.74 10/22/2021   BUN 6 10/22/2021   CO2 29 10/22/2021   TSH 3.87 10/22/2021    DG ESOPHAGUS W DOUBLE CM (HD)  Result Date: 10/01/2022 CLINICAL DATA:  Patient with chronic cough, heartburn symptoms at night. Patient reports 30 year smoking history of 1 pack per day EXAM: ESOPHAGUS/BARIUM SWALLOW/TABLET STUDY TECHNIQUE: Combined double and single contrast examination was performed using effervescent crystals, high-density barium, and thin liquid barium. This exam was performed by Mina Marble, PA-C , and was supervised and interpreted by Dr Carey Bullocks. FLUOROSCOPY: Radiation Exposure Index (as provided by the fluoroscopic device): 13.40 mGy Kerma COMPARISON:  Chest radiographs 06/04/2022.  Chest CT 08/09/2008. FINDINGS: Swallowing: Appears normal. No vestibular penetration or aspiration seen. Pharynx: Unremarkable. Esophagus: No evidence of stricture, mass or ulceration. Suboptimal distensibility of the esophagus on the air-contrast views. Esophageal motility: Within normal limits. Hiatal Hernia: None. Gastroesophageal reflux: None visualized. Ingested 13mm barium tablet: Passed normally Other: Mild cervical spondylosis noted. IMPRESSION: Suboptimal distensibility of the esophagus on the air-contrast views. Otherwise unremarkable esophagram. No significant abnormality identified. Electronically Signed   By: Carey Bullocks M.D.   On: 10/01/2022 10:37       Assessment & Plan:  Mass of breast, unspecified laterality -     MM 3D DIAGNOSTIC MAMMOGRAM BILATERAL BREAST; Future -     [place] LIMITED  ULTRASOUND INCLUDING AXILLA RIGHT BREAST; Future -     [place] LIMITED ULTRASOUND INCLUDING AXILLA LEFT BREAST ; Future  Fatigue, unspecified type Assessment & Plan: Increased fatigue.  Discussed.  Given increased bleeding, will check cbc and iron studies.  Also check metabolic panel and thyroid function.   Orders: -     CBC with Differential/Platelet -     Comprehensive metabolic panel -     IBC + Ferritin -     TSH  Vitamin D deficiency Assessment & Plan: With fatigue, check vitamin D level today.  Currently taking vitamin D 2000 units per day.   Orders: -     VITAMIN D 25 Hydroxy (Vit-D Deficiency, Fractures)  Menorrhagia with irregular cycle Assessment & Plan: Increased bleeding as outlined.  Refer to gyn for further evaluation.    Abnormal mammogram Assessment & Plan: Had f/u mammogram 03/2022 - recommended f/u bilateral diagnostic mammogram in one year.   Ordered.    Cough, unspecified type Assessment & Plan: Not an issue now.  Acid reflux controlled.    Gastroesophageal reflux disease, unspecified whether esophagitis present Assessment & Plan: On protonix.  Now controlled.  Follow.    Hypothyroidism, unspecified type Assessment & Plan: On synthroid.  Follow tsh. Sees Dr Gershon Crane - f/u hypothyroidism. Last seen 11/19/22. TSH wnl. Recheck today.    Tobacco use disorder Assessment & Plan: Discussed smoking cessation.       Dale Cleona, MD

## 2023-03-18 NOTE — Assessment & Plan Note (Signed)
On synthroid.  Follow tsh. Sees Dr Gershon Crane - f/u hypothyroidism. Last seen 11/19/22. TSH wnl. Recheck today.

## 2023-03-18 NOTE — Assessment & Plan Note (Signed)
Had f/u mammogram 03/2022 - recommended f/u bilateral diagnostic mammogram in one year.   Ordered.

## 2023-03-18 NOTE — Assessment & Plan Note (Signed)
Not an issue now.  Acid reflux controlled.

## 2023-03-18 NOTE — Assessment & Plan Note (Signed)
With fatigue, check vitamin D level today.  Currently taking vitamin D 2000 units per day.

## 2023-03-18 NOTE — Assessment & Plan Note (Signed)
Increased bleeding as outlined.  Refer to gyn for further evaluation.

## 2023-03-18 NOTE — Addendum Note (Signed)
Addended by: Rita Ohara D on: 03/18/2023 08:44 AM   Modules accepted: Orders

## 2023-03-18 NOTE — Assessment & Plan Note (Signed)
On protonix.  Now controlled.  Follow.

## 2023-03-20 ENCOUNTER — Other Ambulatory Visit: Payer: Self-pay

## 2023-03-20 MED ORDER — VITAMIN D (ERGOCALCIFEROL) 1.25 MG (50000 UNIT) PO CAPS: 50000.0000 [IU] | ORAL_CAPSULE | ORAL | 0 refills | Status: DC

## 2023-03-20 MED ORDER — LEVOTHYROXINE SODIUM 100 MCG PO TABS: 100.0000 ug | ORAL_TABLET | Freq: Every day | ORAL | 0 refills | Status: DC

## 2023-03-20 NOTE — Telephone Encounter (Signed)
Rxs sent in per result note message.

## 2023-05-25 ENCOUNTER — Telehealth: Payer: Commercial Managed Care - PPO | Admitting: Physician Assistant

## 2023-05-25 DIAGNOSIS — J019 Acute sinusitis, unspecified: Secondary | ICD-10-CM | POA: Diagnosis not present

## 2023-05-25 DIAGNOSIS — B9689 Other specified bacterial agents as the cause of diseases classified elsewhere: Secondary | ICD-10-CM | POA: Diagnosis not present

## 2023-05-25 MED ORDER — DOXYCYCLINE HYCLATE 100 MG PO TABS: 100.0000 mg | ORAL_TABLET | Freq: Two times a day (BID) | ORAL | 0 refills | Status: DC

## 2023-05-25 NOTE — Progress Notes (Signed)

## 2023-07-03 ENCOUNTER — Other Ambulatory Visit: Payer: Self-pay

## 2023-08-24 ENCOUNTER — Other Ambulatory Visit: Payer: Self-pay

## 2023-09-18 ENCOUNTER — Ambulatory Visit: Payer: Commercial Managed Care - PPO | Admitting: Internal Medicine

## 2023-09-18 VITALS — BP 114/70 | HR 79 | Ht 66.0 in | Wt 169.0 lb

## 2023-09-18 DIAGNOSIS — F32A Depression, unspecified: Secondary | ICD-10-CM

## 2023-09-18 DIAGNOSIS — Z1231 Encounter for screening mammogram for malignant neoplasm of breast: Secondary | ICD-10-CM

## 2023-09-18 DIAGNOSIS — R5383 Other fatigue: Secondary | ICD-10-CM

## 2023-09-18 DIAGNOSIS — E039 Hypothyroidism, unspecified: Secondary | ICD-10-CM

## 2023-09-18 DIAGNOSIS — K219 Gastro-esophageal reflux disease without esophagitis: Secondary | ICD-10-CM

## 2023-09-18 DIAGNOSIS — E559 Vitamin D deficiency, unspecified: Secondary | ICD-10-CM | POA: Diagnosis not present

## 2023-09-18 DIAGNOSIS — F419 Anxiety disorder, unspecified: Secondary | ICD-10-CM

## 2023-09-18 LAB — TSH: TSH: 13.23 u[IU]/mL — ABNORMAL HIGH (ref 0.35–5.50)

## 2023-09-18 LAB — COMPREHENSIVE METABOLIC PANEL
ALT: 12 U/L (ref 0–35)
AST: 14 U/L (ref 0–37)
Albumin: 4.5 g/dL (ref 3.5–5.2)
Alkaline Phosphatase: 50 U/L (ref 39–117)
BUN: 12 mg/dL (ref 6–23)
CO2: 28 meq/L (ref 19–32)
Calcium: 9.4 mg/dL (ref 8.4–10.5)
Chloride: 103 meq/L (ref 96–112)
Creatinine, Ser: 0.84 mg/dL (ref 0.40–1.20)
GFR: 82 mL/min (ref >60.00)
Glucose, Bld: 79 mg/dL (ref 70–99)
Potassium: 4 meq/L (ref 3.5–5.1)
Sodium: 138 meq/L (ref 135–145)
Total Bilirubin: 0.5 mg/dL (ref 0.2–1.2)
Total Protein: 7.6 g/dL (ref 6.0–8.3)

## 2023-09-18 LAB — VITAMIN D 25 HYDROXY (VIT D DEFICIENCY, FRACTURES): VITD: 18.28 ng/mL — ABNORMAL LOW (ref 30.00–100.00)

## 2023-09-18 MED ORDER — NYSTATIN 100000 UNIT/GM EX CREA: 1.0000 | TOPICAL_CREAM | Freq: Two times a day (BID) | CUTANEOUS | 0 refills | Status: AC

## 2023-09-18 NOTE — Progress Notes (Signed)
 Subjective:    Patient ID: Audrey Ortega, female    DOB: 22-Feb-1976, 48 y.o.   MRN: [id number]  Patient here for  Chief Complaint  Patient presents with   Medical Management of Chronic Issues    HPI Here for a scheduled follow up - follow up regarding GERD, previous cough and hypothyroidism.  Was seeing Dr Gershon Crane - f/u hypothyroidism. Last seen 11/19/22. TSH wnl at that time. She has been off her thyroid medication for months. Stopped taking. Had been on protonix for acid reflux. Off protonix now. She has not been taking protonix. Reports no acid reflux off medication. Was started on vitamin D supplements. Not taking. Would like vitamin D level checked. Does report increased itching - perirectal itching. No bleeding. Bowels moving. No urine change. No intravaginal itching.    Past Medical History:  Diagnosis Date   Allergy    Anxiety    Chicken pox    Depression    Emphysema of lung (HCC)    Frequent headaches    Hypothyroidism    Migraine    Past Surgical History:  Procedure Laterality Date   NO PAST SURGERIES     Family History  Problem Relation Age of Onset   Arthritis Mother    Hypertension Mother    Diabetes Mother    Arthritis Father    Hypertension Father    Hyperlipidemia Father    Thyroid disease Father        Hypothyroidism   Healthy Daughter    Breast cancer Maternal Aunt 66   Cancer Maternal Aunt        Breast   Alcohol abuse Maternal Grandmother    Diabetes Paternal Grandmother    Hypertension Paternal Grandmother    Diabetes Paternal Grandfather    Alcohol abuse Paternal Grandfather    Hypertension Paternal Grandfather    Heart attack Brother    Hyperlipidemia Brother    Stroke Brother    Social History   Socioeconomic History   Marital status: Legally Separated    Spouse name: Not on file   Number of children: 1   Years of education: Not on file   Highest education level: Not on file  Occupational History   Occupation: Transport planner: [employer]  Tobacco Use   Smoking status: Every Day    Current packs/day: 1.00    Average packs/day: 1 pack/day for 30.0 years (30.0 ttl pk-yrs)    Types: Cigarettes   Smokeless tobacco: Never  Vaping Use   Vaping status: Never Used  Substance and Sexual Activity   Alcohol use: Yes    Comment: Rare use   Drug use: No   Sexual activity: Yes    Partners: Male  Other Topics Concern   Not on file  Social History Narrative   Not on file   Social Drivers of Health   Financial Resource Strain: Not on file  Food Insecurity: Not on file  Transportation Needs: Not on file  Physical Activity: Not on file  Stress: Not on file  Social Connections: Not on file     Review of Systems  Constitutional:  Negative for appetite change and unexpected weight change.  HENT:  Negative for congestion and sinus pressure.   Respiratory:  Negative for cough, chest tightness and shortness of breath.   Cardiovascular:  Negative for chest pain, palpitations and leg swelling.  Gastrointestinal:  Negative for abdominal pain, diarrhea, nausea and vomiting.  Genitourinary:  Negative for  difficulty urinating.       Perirectal irritation.   Musculoskeletal:  Negative for joint swelling and myalgias.  Skin:  Positive for rash.  Neurological:  Negative for dizziness and headaches.  Psychiatric/Behavioral:  Negative for agitation and dysphoric mood.        Objective:     BP 114/70   Pulse 79   Ht 5\' 6"  (1.676 m)   Wt 169 lb (76.7 kg)   SpO2 98%   BMI 27.28 kg/m  Wt Readings from Last 3 Encounters:  09/20/23 169 lb (76.7 kg)  03/18/23 168 lb (76.2 kg)  12/09/22 167 lb 9.6 oz (76 kg)    Physical Exam Vitals reviewed.  Constitutional:      General: She is not in acute distress.    Appearance: Normal appearance.  HENT:     Head: Normocephalic and atraumatic.     Right Ear: External ear normal.     Left Ear: External ear normal.     Mouth/Throat:     Pharynx: No  oropharyngeal exudate or posterior oropharyngeal erythema.  Eyes:     General: No scleral icterus.       Right eye: No discharge.        Left eye: No discharge.     Conjunctiva/sclera: Conjunctivae normal.  Neck:     Thyroid: No thyromegaly.  Cardiovascular:     Rate and Rhythm: Normal rate and regular rhythm.  Pulmonary:     Effort: No respiratory distress.     Breath sounds: Normal breath sounds. No wheezing.  Abdominal:     General: Bowel sounds are normal.     Palpations: Abdomen is soft.     Tenderness: There is no abdominal tenderness.  Genitourinary:    Comments: Perivaginal/peri rectal erythema. No ulcerations.  Musculoskeletal:        General: No swelling or tenderness.     Cervical back: Neck supple. No tenderness.  Lymphadenopathy:     Cervical: No cervical adenopathy.  Neurological:     Mental Status: She is alert.  Psychiatric:        Mood and Affect: Mood normal.        Behavior: Behavior normal.         Outpatient Encounter Medications as of 09/18/2023  Medication Sig   nystatin cream (MYCOSTATIN) Apply 1 Application topically 2 (two) times daily.   budesonide-formoterol (SYMBICORT) 160-4.5 MCG/ACT inhaler Inhale 2 puffs into the lungs in the morning and at bedtime.   ipratropium (ATROVENT) 0.03 % nasal spray Place 2 sprays into both nostrils every 12 (twelve) hours.   levothyroxine (SYNTHROID) 100 MCG tablet Take 1 tablet (100 mcg total) by mouth daily.   loratadine (CLARITIN) 10 MG tablet Take 10 mg by mouth daily.   pantoprazole (PROTONIX) 40 MG tablet TAKE 1 TABLET BY MOUTH EVERY DAY   Vitamin D, Ergocalciferol, (DRISDOL) 1.25 MG (50000 UNIT) CAPS capsule Take 1 capsule (50,000 Units total) by mouth every 7 (seven) days.   [DISCONTINUED] doxycycline (VIBRA-TABS) 100 MG tablet Take 1 tablet (100 mg total) by mouth 2 (two) times daily.   No facility-administered encounter medications on file as of 09/18/2023.     Lab Results  Component Value Date    WBC 7.8 03/18/2023   HGB 13.2 03/18/2023   HCT 40.1 03/18/2023   PLT 327.0 03/18/2023   GLUCOSE 79 09/18/2023   CHOL 155 10/22/2021   TRIG 52.0 10/22/2021   HDL 46.70 10/22/2021   LDLCALC 98 10/22/2021   ALT 12  09/18/2023   AST 14 09/18/2023   NA 138 09/18/2023   K 4.0 09/18/2023   CL 103 09/18/2023   CREATININE 0.84 09/18/2023   BUN 12 09/18/2023   CO2 28 09/18/2023   TSH 13.23 (H) 09/18/2023    DG ESOPHAGUS W DOUBLE CM (HD) Result Date: 10/01/2022 CLINICAL DATA:  Patient with chronic cough, heartburn symptoms at night. Patient reports 30 year smoking history of 1 pack per day EXAM: ESOPHAGUS/BARIUM SWALLOW/TABLET STUDY TECHNIQUE: Combined double and single contrast examination was performed using effervescent crystals, high-density barium, and thin liquid barium. This exam was performed by Mina Marble, PA-C , and was supervised and interpreted by Dr Carey Bullocks. FLUOROSCOPY: Radiation Exposure Index (as provided by the fluoroscopic device): 13.40 mGy Kerma COMPARISON:  Chest radiographs 06/04/2022.  Chest CT 08/09/2008. FINDINGS: Swallowing: Appears normal. No vestibular penetration or aspiration seen. Pharynx: Unremarkable. Esophagus: No evidence of stricture, mass or ulceration. Suboptimal distensibility of the esophagus on the air-contrast views. Esophageal motility: Within normal limits. Hiatal Hernia: None. Gastroesophageal reflux: None visualized. Ingested 13mm barium tablet: Passed normally Other: Mild cervical spondylosis noted. IMPRESSION: Suboptimal distensibility of the esophagus on the air-contrast views. Otherwise unremarkable esophagram. No significant abnormality identified. Electronically Signed   By: Carey Bullocks M.D.   On: 10/01/2022 10:37       Assessment & Plan:  Hypothyroidism, unspecified type Assessment & Plan: Not taking her synthroid now. Has been off for months. Has seen Dr Gershon Crane. Check tsh today.   Orders: -     TSH -     Comprehensive  metabolic panel  Visit for screening mammogram -     3D Screening Mammogram, Left and Right; Future  Vitamin D deficiency Assessment & Plan: Not taking any vitamin D supplements currently. Recheck vitamin d level today.   Orders: -     VITAMIN D 25 Hydroxy (Vit-D Deficiency, Fractures)  Gastroesophageal reflux disease, unspecified whether esophagitis present Assessment & Plan: Off protonix. No acid reflux. Remain off.    Other fatigue Assessment & Plan: Increased fatigue. Check labs - including tsh and vitamin D.    Anxiety and depression Assessment & Plan: Overall appears to be handling things well. Follow.    Other orders -     Nystatin; Apply 1 Application topically 2 (two) times daily.  Dispense: 30 g; Refill: 0     Dale Saluda, MD

## 2023-09-20 ENCOUNTER — Encounter: Payer: Self-pay | Admitting: Internal Medicine

## 2023-09-20 NOTE — Assessment & Plan Note (Signed)
 Not taking her synthroid now. Has been off for months. Has seen Dr Gershon Crane. Check tsh today.

## 2023-09-20 NOTE — Assessment & Plan Note (Signed)
 Not taking any vitamin D supplements currently. Recheck vitamin d level today.

## 2023-09-20 NOTE — Assessment & Plan Note (Signed)
 Off protonix. No acid reflux. Remain off.

## 2023-09-20 NOTE — Assessment & Plan Note (Signed)
 Overall appears to be handling things well.  Follow.  ?

## 2023-09-20 NOTE — Assessment & Plan Note (Signed)
 Increased fatigue. Check labs - including tsh and vitamin D.

## 2023-09-23 ENCOUNTER — Other Ambulatory Visit: Payer: Self-pay

## 2023-09-23 DIAGNOSIS — E039 Hypothyroidism, unspecified: Secondary | ICD-10-CM

## 2023-09-23 MED ORDER — LEVOTHYROXINE SODIUM 50 MCG PO TABS
50.0000 ug | ORAL_TABLET | Freq: Every day | ORAL | 3 refills | Status: DC
  Filled 2023-10-27: qty 90, 90d supply, fill #0

## 2023-09-23 MED ORDER — VITAMIN D (ERGOCALCIFEROL) 1.25 MG (50000 UNIT) PO CAPS: 50000.0000 [IU] | ORAL_CAPSULE | ORAL | 0 refills | Status: DC

## 2023-10-27 ENCOUNTER — Other Ambulatory Visit: Payer: Self-pay

## 2023-10-27 ENCOUNTER — Other Ambulatory Visit (HOSPITAL_BASED_OUTPATIENT_CLINIC_OR_DEPARTMENT_OTHER): Payer: Self-pay

## 2023-10-27 ENCOUNTER — Other Ambulatory Visit: Payer: Self-pay | Admitting: Student in an Organized Health Care Education/Training Program

## 2023-10-27 DIAGNOSIS — R053 Chronic cough: Secondary | ICD-10-CM

## 2023-10-27 DIAGNOSIS — R0602 Shortness of breath: Secondary | ICD-10-CM

## 2023-10-27 MED ORDER — BUDESONIDE-FORMOTEROL FUMARATE 160-4.5 MCG/ACT IN AERO
2.0000 | INHALATION_SPRAY | Freq: Two times a day (BID) | RESPIRATORY_TRACT | 8 refills | Status: AC
  Filled 2023-10-27: qty 10.2, 30d supply, fill #0
  Filled 2024-01-21: qty 10.2, 30d supply, fill #1
  Filled 2024-04-05: qty 10.2, 30d supply, fill #2
  Filled 2024-05-30: qty 10.2, 30d supply, fill #3
  Filled 2024-08-22: qty 10.2, 30d supply, fill #4

## 2023-11-04 ENCOUNTER — Other Ambulatory Visit: Payer: Self-pay | Admitting: Internal Medicine

## 2023-11-04 ENCOUNTER — Other Ambulatory Visit (INDEPENDENT_AMBULATORY_CARE_PROVIDER_SITE_OTHER)

## 2023-11-04 DIAGNOSIS — E039 Hypothyroidism, unspecified: Secondary | ICD-10-CM

## 2023-11-04 LAB — TSH: TSH: 9.4 u[IU]/mL — ABNORMAL HIGH (ref 0.35–5.50)

## 2023-11-04 NOTE — Progress Notes (Signed)
Order placed for f/u tsh.  

## 2023-11-05 ENCOUNTER — Other Ambulatory Visit: Payer: Self-pay

## 2023-11-05 DIAGNOSIS — E039 Hypothyroidism, unspecified: Secondary | ICD-10-CM

## 2023-11-05 MED ORDER — LEVOTHYROXINE SODIUM 75 MCG PO TABS
75.0000 ug | ORAL_TABLET | Freq: Every day | ORAL | 1 refills | Status: AC
  Filled 2023-11-05 – 2024-04-05 (×2): qty 90, 90d supply, fill #0

## 2023-11-16 ENCOUNTER — Other Ambulatory Visit: Payer: Self-pay

## 2023-12-17 ENCOUNTER — Other Ambulatory Visit

## 2023-12-24 ENCOUNTER — Encounter: Payer: Self-pay | Admitting: Internal Medicine

## 2023-12-25 NOTE — Telephone Encounter (Signed)
 Patient is aware

## 2023-12-25 NOTE — Telephone Encounter (Signed)
 Called patient. She is not having any active symptoms but since her dentist appt on 5/28 she has intermittently felt like her balance is off and having some dizzy spells. No acute symptoms since yesterday. She has not passed out or had any falls. Her BP when she checked it was 100/66 (she says normal for her.) Advised patient if her symptoms return- she needs to go to urgent care to be evaluated. Scheduled patient for appt with you 6/10. She did agree to go to [facility] urgent care if symptoms return before appt.

## 2023-12-25 NOTE — Telephone Encounter (Signed)
 Agree with need for evaluation. If symptoms are worse (per note - yesterday worse) - recommend evaluation today to confirm nothing more acute going on and can still keep f/u with me to reevaluate

## 2023-12-26 ENCOUNTER — Ambulatory Visit
Admission: RE | Admit: 2023-12-26 | Discharge: 2023-12-26 | Disposition: A | Discharge status: Home / Self Care | Payer: Self-pay | Source: Ambulatory Visit

## 2023-12-26 VITALS — BP 108/74 | HR 71 | Temp 97.9°F | Resp 16 | Ht 66.0 in | Wt 169.1 lb

## 2023-12-26 DIAGNOSIS — H8110 Benign paroxysmal vertigo, unspecified ear: Secondary | ICD-10-CM

## 2023-12-26 MED ORDER — MECLIZINE HCL 25 MG PO TABS
25.0000 mg | ORAL_TABLET | Freq: Once | ORAL | Status: AC
  Administered 2023-12-26: 25 mg via ORAL

## 2023-12-26 MED ORDER — MECLIZINE HCL 25 MG PO TABS: 25.0000 mg | ORAL_TABLET | Freq: Three times a day (TID) | ORAL | 0 refills | Status: AC | PRN

## 2023-12-26 MED ORDER — ONDANSETRON 4 MG PO TBDP: 4.0000 mg | ORAL_TABLET | Freq: Three times a day (TID) | ORAL | 0 refills | Status: AC | PRN

## 2023-12-26 NOTE — Discharge Instructions (Addendum)
  1. Benign paroxysmal positional vertigo, unspecified laterality (Primary) - meclizine (ANTIVERT) tablet 25 mg given in UC for acute positional vertigo - meclizine (ANTIVERT) 25 MG tablet; Take 1 tablet (25 mg total) by mouth 3 (three) times daily as needed for dizziness.  Dispense: 30 tablet; Refill: 0 - ondansetron  (ZOFRAN -ODT) 4 MG disintegrating tablet; Take 1 tablet (4 mg total) by mouth every 8 (eight) hours as needed for nausea or vomiting.  Dispense: 20 tablet; Refill: 0 - Follow-up on Tuesday as scheduled with your primary care provider for further evaluation and management of ongoing dizziness/vertigo. -Continue to monitor symptoms for any change in severity if there is any escalation of current symptoms or development of new symptoms follow-up in ER for further evaluation and management.

## 2023-12-26 NOTE — ED Triage Notes (Addendum)
 Pt c/o intermittent dizziness. She states it has been going on since 5/28 after a dentist appt. She states she was in a dental chair and felt like her feet were elevated above her head and when she got up she got very dizzy and felt like she was going to pass out. She has not had anymore presyncopal episodes but has felt dizzy on and off since. She denies chest pain, sweats, headache, or palpitations. Pt has an appt with her pcp next Tuesday.

## 2023-12-26 NOTE — ED Provider Notes (Signed)
 [facility]  Note:  This document was prepared using Conservation officer, historic buildings and may include unintentional dictation errors.  MRN: [id number] DOB: 1976/05/11  Subjective:   Audrey Ortega is a 48 y.o. female presenting for intermittent dizziness x 8 to 9 days.  Patient reports that she had a dentist appointment where she was laid back in the dental chair for approximately an hour and when she sat up she became acutely dizzy.  Patient states that since that time she has had intermittent dizziness with occasional nausea off and on.  Patient denies any syncope or altered mental status.  No chest pain, shortness of breath, headache, palpitations, weakness, fatigue.  Patient has follow-up appointment with her primary care provider on Tuesday of next week but was advised by PCP to come to urgent care for evaluation of symptoms.  No current facility-administered medications for this encounter.  Current Outpatient Medications:    budesonide -formoterol  (SYMBICORT ) 160-4.5 MCG/ACT inhaler, Inhale 2 puffs into the lungs in the morning and at bedtime., Disp: 10.2 g, Rfl: 8   levothyroxine  (SYNTHROID ) 75 MCG tablet, Take 1 tablet (75 mcg total) by mouth daily., Disp: 90 tablet, Rfl: 1   loratadine (CLARITIN) 10 MG tablet, Take 10 mg by mouth daily., Disp: , Rfl:    meclizine (ANTIVERT) 25 MG tablet, Take 1 tablet (25 mg total) by mouth 3 (three) times daily as needed for dizziness., Disp: 30 tablet, Rfl: 0   ondansetron  (ZOFRAN -ODT) 4 MG disintegrating tablet, Take 1 tablet (4 mg total) by mouth every 8 (eight) hours as needed for nausea or vomiting., Disp: 20 tablet, Rfl: 0   pantoprazole  (PROTONIX ) 40 MG tablet, TAKE 1 TABLET BY MOUTH EVERY DAY, Disp: 90 tablet, Rfl: 1   ipratropium (ATROVENT ) 0.03 % nasal spray, Place 2 sprays into both nostrils every 12 (twelve) hours., Disp: 30 mL, Rfl: 0   nystatin  cream (MYCOSTATIN ), Apply 1 Application topically 2 (two) times daily., Disp: 30 g,  Rfl: 0   Vitamin D , Ergocalciferol , (DRISDOL ) 1.25 MG (50000 UNIT) CAPS capsule, Take 1 capsule (50,000 Units total) by mouth every 7 (seven) days., Disp: 12 capsule, Rfl: 0   Allergies  Allergen Reactions   Amoxicillin-Pot Clavulanate Hives and Rash    Past Medical History:  Diagnosis Date   Allergy    Anxiety    Chicken pox    Depression    Emphysema of lung (HCC)    Frequent headaches    Hypothyroidism    Migraine      Past Surgical History:  Procedure Laterality Date   NO PAST SURGERIES      Family History  Problem Relation Age of Onset   Arthritis Mother    Hypertension Mother    Diabetes Mother    Arthritis Father    Hypertension Father    Hyperlipidemia Father    Thyroid  disease Father        Hypothyroidism   Healthy Daughter    Breast cancer Maternal Aunt 45   Cancer Maternal Aunt        Breast   Alcohol abuse Maternal Grandmother    Diabetes Paternal Grandmother    Hypertension Paternal Grandmother    Diabetes Paternal Grandfather    Alcohol abuse Paternal Grandfather    Hypertension Paternal Grandfather    Heart attack Brother    Hyperlipidemia Brother    Stroke Brother     Social History   Tobacco Use   Smoking status: Every Day    Current packs/day: 1.00  Average packs/day: 1 pack/day for 30.0 years (30.0 ttl pk-yrs)    Types: Cigarettes   Smokeless tobacco: Never  Vaping Use   Vaping status: Never Used  Substance Use Topics   Alcohol use: Yes    Comment: Rare use   Drug use: No    ROS Refer to HPI for ROS details.  Objective:   Vitals: BP 108/74 (BP Location: Right Arm)   Pulse 71   Temp 97.9 F (36.6 C) (Oral)   Resp 16   Ht 5\' 6"  (1.676 m)   Wt 169 lb 1.5 oz (76.7 kg)   LMP 12/16/2023 (Approximate)   SpO2 96%   BMI 27.29 kg/m   Physical Exam Vitals and nursing note reviewed.  Constitutional:      General: She is not in acute distress.    Appearance: Normal appearance. She is well-developed. She is not  ill-appearing or toxic-appearing.  HENT:     Head: Normocephalic and atraumatic.     Right Ear: Tympanic membrane, ear canal and external ear normal.     Left Ear: Tympanic membrane, ear canal and external ear normal.     Nose: Nose normal. No congestion or rhinorrhea.     Mouth/Throat:     Mouth: Mucous membranes are moist.     Pharynx: Oropharynx is clear.  Eyes:     General:        Right eye: No discharge.        Left eye: No discharge.     Extraocular Movements: Extraocular movements intact.     Conjunctiva/sclera: Conjunctivae normal.     Pupils: Pupils are equal, round, and reactive to light.  Cardiovascular:     Rate and Rhythm: Normal rate.  Pulmonary:     Effort: Pulmonary effort is normal. No respiratory distress.  Skin:    General: Skin is warm and dry.  Neurological:     General: No focal deficit present.     Mental Status: She is alert and oriented to person, place, and time. Mental status is at baseline.     Cranial Nerves: No cranial nerve deficit.     Motor: No weakness.  Psychiatric:        Mood and Affect: Mood normal.        Behavior: Behavior normal.     Procedures  No results found for this or any previous visit (from the past 24 hours).  Assessment and Plan :     Discharge Instructions       1. Benign paroxysmal positional vertigo, unspecified laterality (Primary) - meclizine (ANTIVERT) tablet 25 mg given in UC for acute positional vertigo - meclizine (ANTIVERT) 25 MG tablet; Take 1 tablet (25 mg total) by mouth 3 (three) times daily as needed for dizziness.  Dispense: 30 tablet; Refill: 0 - ondansetron  (ZOFRAN -ODT) 4 MG disintegrating tablet; Take 1 tablet (4 mg total) by mouth every 8 (eight) hours as needed for nausea or vomiting.  Dispense: 20 tablet; Refill: 0 - Follow-up on Tuesday as scheduled with your primary care provider for further evaluation and management of ongoing dizziness/vertigo. -Continue to monitor symptoms for any change in  severity if there is any escalation of current symptoms or development of new symptoms follow-up in ER for further evaluation and management.    Jisele Price B Korbin Notaro   Leona Alen, [place], [place] 12/26/23 1130

## 2023-12-29 ENCOUNTER — Ambulatory Visit: Admitting: Internal Medicine

## 2023-12-29 VITALS — BP 112/70 | HR 70 | Temp 98.0°F | Resp 16 | Ht 66.0 in | Wt 179.0 lb

## 2023-12-29 DIAGNOSIS — F419 Anxiety disorder, unspecified: Secondary | ICD-10-CM

## 2023-12-29 DIAGNOSIS — F32A Depression, unspecified: Secondary | ICD-10-CM

## 2023-12-29 DIAGNOSIS — R42 Dizziness and giddiness: Secondary | ICD-10-CM | POA: Diagnosis not present

## 2023-12-29 NOTE — Progress Notes (Signed)
 Subjective:    Patient ID: Audrey Ortega, female    DOB: 1976-06-29, 48 y.o.   MRN: [id number]  Patient here for  Chief Complaint  Patient presents with   Dizziness    HPI Work in appt - work in with concerns regarding dizziness. Evaluated 12/26/23 - UC - intermittent dizziness. Started after dental appt - after lying back in dental chair - when sat up - dizzy. Described her balance being off and felt drunk. Persistent intermittent dizziness with occasional nausea. Diagnosed with BPPV. Prescribed meclizine  and zofran . Reports still noticing some dizziness. When first out of bed or bending over - will notice the sensation. No problems when lying flat. No headache. No chest pain. Breathing stable. Eating. Staying hydrated.    Past Medical History:  Diagnosis Date   Allergy    Anxiety    Chicken pox    Depression    Emphysema of lung (HCC)    Frequent headaches    Hypothyroidism    Migraine    Past Surgical History:  Procedure Laterality Date   NO PAST SURGERIES     Family History  Problem Relation Age of Onset   Arthritis Mother    Hypertension Mother    Diabetes Mother    Arthritis Father    Hypertension Father    Hyperlipidemia Father    Thyroid  disease Father        Hypothyroidism   Healthy Daughter    Breast cancer Maternal Aunt 1   Cancer Maternal Aunt        Breast   Alcohol abuse Maternal Grandmother    Diabetes Paternal Grandmother    Hypertension Paternal Grandmother    Diabetes Paternal Grandfather    Alcohol abuse Paternal Grandfather    Hypertension Paternal Grandfather    Heart attack Brother    Hyperlipidemia Brother    Stroke Brother    Social History   Socioeconomic History   Marital status: Legally Separated    Spouse name: Not on file   Number of children: 1   Years of education: Not on file   Highest education level: Not on file  Occupational History   Occupation: Research scientist (physical sciences): [employer]  Tobacco Use    Smoking status: Every Day    Current packs/day: 1.00    Average packs/day: 1 pack/day for 30.0 years (30.0 ttl pk-yrs)    Types: Cigarettes   Smokeless tobacco: Never  Vaping Use   Vaping status: Never Used  Substance and Sexual Activity   Alcohol use: Yes    Comment: Rare use   Drug use: No   Sexual activity: Yes    Partners: Male  Other Topics Concern   Not on file  Social History Narrative   Not on file   Social Drivers of Health   Financial Resource Strain: Not on file  Food Insecurity: Not on file  Transportation Needs: Not on file  Physical Activity: Not on file  Stress: Not on file  Social Connections: Not on file     Review of Systems  Constitutional:  Negative for appetite change and unexpected weight change.  HENT:  Negative for congestion and sinus pressure.   Respiratory:  Negative for cough, chest tightness and shortness of breath.   Cardiovascular:  Negative for chest pain, palpitations and leg swelling.  Gastrointestinal:  Negative for abdominal pain, diarrhea, nausea and vomiting.  Genitourinary:  Negative for difficulty urinating and dysuria.  Musculoskeletal:  Negative for joint swelling  and myalgias.  Skin:  Negative for color change and rash.  Neurological:  Positive for dizziness. Negative for headaches.  Psychiatric/Behavioral:  Negative for agitation and dysphoric mood.        Objective:     BP 112/70   Pulse 70   Temp 98 F (36.7 C)   Resp 16   Ht 5' 6 (1.676 m)   Wt 179 lb (81.2 kg)   LMP 12/16/2023 (Approximate)   SpO2 98%   BMI 28.89 kg/m  Wt Readings from Last 3 Encounters:  12/29/23 179 lb (81.2 kg)  12/26/23 169 lb 1.5 oz (76.7 kg)  09/20/23 169 lb (76.7 kg)    Physical Exam Vitals reviewed.  Constitutional:      General: She is not in acute distress.    Appearance: Normal appearance.  HENT:     Head: Normocephalic and atraumatic.     Right Ear: External ear normal.     Left Ear: External ear normal.      Mouth/Throat:     Pharynx: No oropharyngeal exudate or posterior oropharyngeal erythema.   Eyes:     General: No scleral icterus.       Right eye: No discharge.        Left eye: No discharge.     Conjunctiva/sclera: Conjunctivae normal.   Neck:     Thyroid : No thyromegaly.   Cardiovascular:     Rate and Rhythm: Normal rate and regular rhythm.  Pulmonary:     Effort: No respiratory distress.     Breath sounds: Normal breath sounds. No wheezing.  Abdominal:     General: Bowel sounds are normal.     Palpations: Abdomen is soft.     Tenderness: There is no abdominal tenderness.   Musculoskeletal:        General: No swelling or tenderness.     Cervical back: Neck supple. No tenderness.  Lymphadenopathy:     Cervical: No cervical adenopathy.   Skin:    Findings: No erythema or rash.   Neurological:     Mental Status: She is alert.     Comments: Reproducible symptoms with lying flat and looking to the right. (Not noticed looking to the left).   Psychiatric:        Mood and Affect: Mood normal.        Behavior: Behavior normal.         Outpatient Encounter Medications as of 12/29/2023  Medication Sig   budesonide -formoterol  (SYMBICORT ) 160-4.5 MCG/ACT inhaler Inhale 2 puffs into the lungs in the morning and at bedtime.   ipratropium (ATROVENT ) 0.03 % nasal spray Place 2 sprays into both nostrils every 12 (twelve) hours.   levothyroxine  (SYNTHROID ) 75 MCG tablet Take 1 tablet (75 mcg total) by mouth daily.   loratadine (CLARITIN) 10 MG tablet Take 10 mg by mouth daily.   meclizine  (ANTIVERT ) 25 MG tablet Take 1 tablet (25 mg total) by mouth 3 (three) times daily as needed for dizziness.   nystatin  cream (MYCOSTATIN ) Apply 1 Application topically 2 (two) times daily.   ondansetron  (ZOFRAN -ODT) 4 MG disintegrating tablet Take 1 tablet (4 mg total) by mouth every 8 (eight) hours as needed for nausea or vomiting.   pantoprazole  (PROTONIX ) 40 MG tablet TAKE 1 TABLET BY MOUTH  EVERY DAY   [DISCONTINUED] Vitamin D , Ergocalciferol , (DRISDOL ) 1.25 MG (50000 UNIT) CAPS capsule Take 1 capsule (50,000 Units total) by mouth every 7 (seven) days.   No facility-administered encounter medications on file as of 12/29/2023.  Lab Results  Component Value Date   WBC 7.8 03/18/2023   HGB 13.2 03/18/2023   HCT 40.1 03/18/2023   PLT 327.0 03/18/2023   GLUCOSE 79 09/18/2023   CHOL 155 10/22/2021   TRIG 52.0 10/22/2021   HDL 46.70 10/22/2021   LDLCALC 98 10/22/2021   ALT 12 09/18/2023   AST 14 09/18/2023   NA 138 09/18/2023   K 4.0 09/18/2023   CL 103 09/18/2023   CREATININE 0.84 09/18/2023   BUN 12 09/18/2023   CO2 28 09/18/2023   TSH 9.40 (H) 11/04/2023       Assessment & Plan:  Dizziness Assessment & Plan: Symptoms and exam appear to be c/w vertigo. Worse with looking to the right as outlined. Has meclizine . Has improved some. Discussed slow position changes and movements and keep head straight. Given persistence, will have ENT evaluate and treat.   Orders: -     Ambulatory referral to ENT  Anxiety and depression Assessment & Plan: Overall appears to be handling things relatively well.       Dellar Fenton, MD

## 2024-01-03 ENCOUNTER — Encounter: Payer: Self-pay | Admitting: Internal Medicine

## 2024-01-03 NOTE — Assessment & Plan Note (Signed)
 Symptoms and exam appear to be c/w vertigo. Worse with looking to the right as outlined. Has meclizine . Has improved some. Discussed slow position changes and movements and keep head straight. Given persistence, will have ENT evaluate and treat.

## 2024-01-03 NOTE — Assessment & Plan Note (Signed)
Overall appears to be handling things relatively well. 

## 2024-01-19 ENCOUNTER — Encounter: Payer: Commercial Managed Care - PPO | Admitting: Internal Medicine

## 2024-01-21 ENCOUNTER — Other Ambulatory Visit: Payer: Self-pay

## 2024-01-27 ENCOUNTER — Other Ambulatory Visit: Payer: Self-pay

## 2024-01-27 MED ORDER — MECLIZINE HCL 25 MG PO TABS
25.0000 mg | ORAL_TABLET | Freq: Three times a day (TID) | ORAL | 1 refills | Status: AC | PRN
  Filled 2024-01-27: qty 30, 10d supply, fill #0

## 2024-02-08 ENCOUNTER — Other Ambulatory Visit: Payer: Self-pay

## 2024-04-05 ENCOUNTER — Other Ambulatory Visit: Payer: Self-pay

## 2024-05-30 ENCOUNTER — Other Ambulatory Visit: Payer: Self-pay

## 2024-08-22 ENCOUNTER — Other Ambulatory Visit: Payer: Self-pay

## 2024-08-22 ENCOUNTER — Other Ambulatory Visit (HOSPITAL_COMMUNITY): Payer: Self-pay
